# Patient Record
Sex: Male | Born: 1950 | Race: White | Hispanic: No | Marital: Married | Smoking: Never smoker
Health system: Southern US, Community
[De-identification: ages and names within clinical notes are randomized; demographics above are authoritative.]

## PROBLEM LIST (undated history)

## (undated) DIAGNOSIS — E785 Hyperlipidemia, unspecified: Secondary | ICD-10-CM

## (undated) DIAGNOSIS — I10 Essential (primary) hypertension: Secondary | ICD-10-CM

## (undated) DIAGNOSIS — K5792 Diverticulitis of intestine, part unspecified, without perforation or abscess without bleeding: Secondary | ICD-10-CM

## (undated) DIAGNOSIS — Z85038 Personal history of other malignant neoplasm of large intestine: Secondary | ICD-10-CM

## (undated) DIAGNOSIS — Z8719 Personal history of other diseases of the digestive system: Secondary | ICD-10-CM

## (undated) HISTORY — DX: Personal history of other diseases of the digestive system: Z87.19

## (undated) HISTORY — DX: Personal history of other malignant neoplasm of large intestine: Z85.038

## (undated) HISTORY — DX: Hyperlipidemia, unspecified: E78.5

## (undated) HISTORY — PX: COLONOSCOPY: SHX174

## (undated) HISTORY — DX: Diverticulitis of intestine, part unspecified, without perforation or abscess without bleeding: K57.92

## (undated) HISTORY — DX: Essential (primary) hypertension: I10

---

## 2006-07-22 HISTORY — PX: HEMICOLECTOMY: SHX854

## 2006-08-18 ENCOUNTER — Encounter: Payer: Self-pay | Admitting: Family Medicine

## 2008-12-31 ENCOUNTER — Ambulatory Visit: Payer: Self-pay | Admitting: Family Medicine

## 2008-12-31 DIAGNOSIS — Z9189 Other specified personal risk factors, not elsewhere classified: Secondary | ICD-10-CM | POA: Insufficient documentation

## 2008-12-31 DIAGNOSIS — C189 Malignant neoplasm of colon, unspecified: Secondary | ICD-10-CM | POA: Insufficient documentation

## 2009-01-01 DIAGNOSIS — I10 Essential (primary) hypertension: Secondary | ICD-10-CM | POA: Insufficient documentation

## 2009-01-01 DIAGNOSIS — Z85038 Personal history of other malignant neoplasm of large intestine: Secondary | ICD-10-CM | POA: Insufficient documentation

## 2009-01-01 DIAGNOSIS — E785 Hyperlipidemia, unspecified: Secondary | ICD-10-CM | POA: Insufficient documentation

## 2009-01-01 DIAGNOSIS — R55 Syncope and collapse: Secondary | ICD-10-CM

## 2009-01-01 DIAGNOSIS — Z8719 Personal history of other diseases of the digestive system: Secondary | ICD-10-CM | POA: Insufficient documentation

## 2009-01-03 ENCOUNTER — Ambulatory Visit: Payer: Self-pay | Admitting: Oncology

## 2009-01-30 ENCOUNTER — Encounter: Payer: Self-pay | Admitting: Family Medicine

## 2009-03-18 ENCOUNTER — Encounter (INDEPENDENT_AMBULATORY_CARE_PROVIDER_SITE_OTHER): Payer: Self-pay | Admitting: *Deleted

## 2009-03-18 ENCOUNTER — Ambulatory Visit: Payer: Self-pay | Admitting: Family Medicine

## 2009-03-18 DIAGNOSIS — R5381 Other malaise: Secondary | ICD-10-CM

## 2009-03-18 DIAGNOSIS — R5383 Other fatigue: Secondary | ICD-10-CM

## 2009-03-18 LAB — CONVERTED CEMR LAB
ALT: 35 units/L (ref 0–53)
AST: 30 units/L (ref 0–37)
BUN: 11 mg/dL (ref 6–23)
Basophils Absolute: 0 10*3/uL (ref 0.0–0.1)
Basophils Relative: 0.4 % (ref 0.0–3.0)
CO2: 27 meq/L (ref 19–32)
Chloride: 106 meq/L (ref 96–112)
Cholesterol: 149 mg/dL (ref 0–200)
Creatinine, Ser: 0.9 mg/dL (ref 0.4–1.5)
HCT: 44.3 % (ref 39.0–52.0)
Hemoglobin: 15.4 g/dL (ref 13.0–17.0)
LDL Cholesterol: 77 mg/dL (ref 0–99)
Lymphocytes Relative: 20 % (ref 12.0–46.0)
Lymphs Abs: 1.3 10*3/uL (ref 0.7–4.0)
MCHC: 34.7 g/dL (ref 30.0–36.0)
Monocytes Relative: 7.9 % (ref 3.0–12.0)
Neutro Abs: 4.6 10*3/uL (ref 1.4–7.7)
Potassium: 4.1 meq/L (ref 3.5–5.1)
RBC: 5.11 M/uL (ref 4.22–5.81)
RDW: 12.5 % (ref 11.5–14.6)
Total CHOL/HDL Ratio: 3
Total Protein: 7.8 g/dL (ref 6.0–8.3)
Triglycerides: 140 mg/dL (ref 0.0–149.0)

## 2009-05-22 ENCOUNTER — Telehealth: Payer: Self-pay | Admitting: Family Medicine

## 2009-07-10 ENCOUNTER — Ambulatory Visit: Payer: Self-pay | Admitting: Family Medicine

## 2009-07-11 LAB — CONVERTED CEMR LAB: PSA: 2.41 ng/mL (ref 0.10–4.00)

## 2009-08-07 ENCOUNTER — Ambulatory Visit: Payer: Self-pay | Admitting: Oncology

## 2009-08-12 ENCOUNTER — Encounter: Payer: Self-pay | Admitting: Family Medicine

## 2009-12-11 ENCOUNTER — Telehealth: Payer: Self-pay | Admitting: Family Medicine

## 2009-12-17 ENCOUNTER — Ambulatory Visit: Payer: Self-pay | Admitting: Family Medicine

## 2009-12-17 LAB — CONVERTED CEMR LAB
AST: 29 units/L (ref 0–37)
Albumin: 4.6 g/dL (ref 3.5–5.2)
BUN: 9 mg/dL (ref 6–23)
Basophils Relative: 0.1 % (ref 0.0–3.0)
Calcium: 9.4 mg/dL (ref 8.4–10.5)
Cholesterol: 155 mg/dL (ref 0–200)
Creatinine, Ser: 1 mg/dL (ref 0.4–1.5)
Eosinophils Relative: 3 % (ref 0.0–5.0)
GFR calc non Af Amer: 80.83 mL/min (ref >60)
Glucose, Bld: 98 mg/dL (ref 70–99)
HCT: 44.7 % (ref 39.0–52.0)
HDL: 48.5 mg/dL (ref >39.00)
Hemoglobin: 15 g/dL (ref 13.0–17.0)
LDL Cholesterol: 67 mg/dL (ref 0–99)
Lymphs Abs: 1.2 10*3/uL (ref 0.7–4.0)
MCV: 86.8 fL (ref 78.0–100.0)
Monocytes Absolute: 0.5 10*3/uL (ref 0.1–1.0)
Monocytes Relative: 8.7 % (ref 3.0–12.0)
PSA: 1.74 ng/mL (ref 0.10–4.00)
Platelets: 189 10*3/uL (ref 150.0–400.0)
Potassium: 3.9 meq/L (ref 3.5–5.1)
RBC: 5.15 M/uL (ref 4.22–5.81)
Total Bilirubin: 0.6 mg/dL (ref 0.3–1.2)
Triglycerides: 196 mg/dL — ABNORMAL HIGH (ref 0.0–149.0)
VLDL: 39.2 mg/dL (ref 0.0–40.0)
WBC: 5.5 10*3/uL (ref 4.5–10.5)

## 2009-12-25 ENCOUNTER — Ambulatory Visit: Payer: Self-pay | Admitting: Family Medicine

## 2010-04-03 ENCOUNTER — Ambulatory Visit: Payer: Self-pay | Admitting: Oncology

## 2010-04-09 ENCOUNTER — Encounter: Payer: Self-pay | Admitting: Family Medicine

## 2010-10-09 ENCOUNTER — Encounter: Payer: Self-pay | Admitting: Family Medicine

## 2010-10-21 NOTE — Assessment & Plan Note (Signed)
Summary: CPX/DLO   Vital Signs:  Patient profile:   60 year old male Height:      68.5 inches Weight:      197.6 pounds BMI:     29.71 Temp:     98.2 degrees F oral Pulse rate:   88 / minute Pulse rhythm:   regular BP sitting:   128 / 82  (left arm) Cuff size:   regular  Vitals Entered By: Benny Lennert CMA Duncan Dull) (December 25, 2009 8:40 AM)  History of Present Illness: Chief complaint cpx   h/o colon CA  DRE  2007, had a hemicolectomy - to have f/u in 08/2010  Feels a whole lot better, not feeling any anxiety.   Preventive Screening-Counseling & Management  Alcohol-Tobacco     Alcohol drinks/day: 0     Alcohol Counseling: not indicated; use of alcohol is not excessive or problematic     Smoking Status: never  Caffeine-Diet-Exercise     Does Patient Exercise: no     Exercise Counseling: to improve exercise regimen  Hep-HIV-STD-Contraception     STD Risk: no risk noted     Testicular SE Education/Counseling to perform regular STE      Sexual History:  currently monogamous.        Drug Use:  never.    Allergies: 1)  ! Penicillin 2)  ! Percocet  Past History:  Past medical, surgical, family and social histories (including risk factors) reviewed, and no changes noted (except as noted below).  Past Medical History: Reviewed history from 12/31/2008 and no changes required. HYPERTENSION HYPERLIPIDEMIA  DIVERTICULITIS COLON CANCER, Stage II Adenocarcinoma of colon     Past Surgical History: Reviewed history from 12/31/2008 and no changes required. Hemicolectomy 07-2006  Colonoscopy, surveilance, 09/18/08 - no CA, repeat 3 years  Family History: Reviewed history and no changes required.  Social History: Reviewed history from 12/31/2008 and no changes required. Retired, recently moved to [place] [employer] [place] Married Never Smoked Alcohol use-yes Regular exercise-yesDoes Patient Exercise:  no STD Risk:  no risk noted Sexual History:  currently  monogamous Drug Use:  never  Review of Systems  General: Denies fever, chills, sweats, anorexia, fatigue, weakness, malaise Eyes: Denies blurring, vision loss ENT: Denies earache, nasal congestion, nosebleeds, sore throat, and hoarseness.  Cardiovascular: Denies chest pains, palpitations, syncope, dyspnea on exertion,  Respiratory: Denies cough, dyspnea at rest, excessive sputum,wheeezing GI: Denies nausea, vomiting, diarrhea, constipation, change in bowel habits, abdominal pain, melena, hematochezia GU: Denies dysuria, hematuria, discharge, urinary frequency, urinary hesitancy, nocturia, incontinence, genital sores, decreased libido Musculoskeletal: Denies back pain, joint pain Derm: Denies rash, itching Neuro: Denies  paresthesias, frequent falls, frequent headaches, and difficulty walking.  Psych: Denies depression, anxiety Endocrine: Denies cold intolerance, heat intolerance, polydipsia, polyphagia, polyuria, and unusual weight change.  Heme: Denies enlarged lymph nodes Allergy: No hayfever   Otherwise, the pertinent positives and negatives are listed above and in the HPI, otherwise a full review of systems has been reviewed and is negative unless noted positive.    Impression & Recommendations:  Problem # 1:  HEALTH MAINTENANCE EXAM (ICD-V70.0) The patient's preventative maintenance and recommended screening tests for an annual wellness exam were reviewed in full today. Brought up to date unless services declined.  Counselled on the importance of diet, exercise, and its role in overall health and mortality. The patient's FH and SH was reviewed, including their home life, tobacco status, and drug and alcohol status.   reviewed all labs  Complete Medication  List: 1)  Pravastatin Sodium 80 Mg Tabs (Pravastatin sodium) .... One by mouth at bedtime 2)  Lisinopril 10 Mg Tabs (Lisinopril) .... Take 1 tab by mouth daily 3)  Aspirin 81 Mg Tbec (Aspirin) .... One by mouth every  day  Patient Instructions: 1)  f/u 1 year 2)  Prephysical Labs, several days before, fasting 3)  BMP, HFP, FLP, CBC with diff, TSH, PSA: 272.4, v77.1, ,780.79, v76.44  4)  153.9, CEA  Current Allergies (reviewed today): ! PENICILLIN ! PERCOCET  Physical Exam General Appearance: well developed, well nourished, no acute distress Eyes: conjunctiva and lids normal, PERRLA, EOMI Ears, Nose, Mouth, Throat: TM clear, nares clear, oral exam WNL Neck: supple, no lymphadenopathy, no thyromegaly, no JVD Respiratory: clear to auscultation and percussion, respiratory effort normal Cardiovascular: regular rate and rhythm, S1-S2, no murmur, rub or gallop, no bruits, peripheral pulses normal and symmetric, no cyanosis, clubbing, edema or varicosities Chest: no scars, masses, tenderness; no asymmetry, skin changes, nipple discharge, no gynecomastia   Gastrointestinal: soft, non-tender; no hepatosplenomegaly, masses; active bowel sounds all quadrants Genitourinary: no hernia, testicular mass, penile discharge, priapism or prostate enlargement Lymphatic: no cervical, axillary or inguinal adenopathy Musculoskeletal: gait normal, muscle tone and strength WNL, no joint swelling, effusions, discoloration, crepitus  Skin: clear, good turgor, color WNL, no rashes, lesions, or ulcerations Neurologic: normal mental status, normal reflexes, normal strength, sensation, and motion Psychiatric: alert; oriented to person, place and time Other Exam:

## 2010-10-21 NOTE — Progress Notes (Signed)
Summary: regarding ordered labs  Phone Note Call from Patient Call back at Home Phone [phone]   Caller: Patient Summary of Call: Pt is coming in for labs next week and Dr. Park Breed at cancer center wants a CMET drawn, pt is scheduled for BMET.  OK to change?   Initial call taken by: Lowella Petties CMA,  December 11, 2009 12:56 PM  Follow-up for Phone Call        yes Follow-up by: Hannah Beat MD,  December 11, 2009 1:01 PM

## 2010-10-21 NOTE — Letter (Signed)
[facility]   Imported By: Lanelle Bal 04/17/2010 08:57:54  _____________________________________________________________________  External Attachment:    Type:   Image     Comment:   External Document

## 2010-10-23 NOTE — Miscellaneous (Signed)
Summary: med list update- pravastatin dose change  Clinical Lists Changes  Medications: Changed medication from PRAVASTATIN SODIUM 80 MG  TABS (PRAVASTATIN SODIUM) one by mouth at bedtime to PRAVASTATIN SODIUM 40 MG TABS (PRAVASTATIN SODIUM) take two by mouth at bedtime     Prior Medications: PRAVASTATIN SODIUM 40 MG TABS (PRAVASTATIN SODIUM) take two by mouth at bedtime LISINOPRIL 10 MG  TABS (LISINOPRIL) Take 1 tab by mouth daily ASPIRIN 81 MG  TBEC (ASPIRIN) one by mouth every day Current Allergies: ! PENICILLIN ! PERCOCET [employer] [address] requested to change pravastatin dose and directions since pt does not have insurance and the 40 mg's will be less expensive for him.               Lowella Petties CMA, AAMA  October 09, 2010 1:11 PM  ok.Taedyn Glasscock MD  October 09, 2010 1:39 PM

## 2010-11-12 ENCOUNTER — Encounter: Payer: Self-pay | Admitting: Family Medicine

## 2010-11-12 DIAGNOSIS — K5792 Diverticulitis of intestine, part unspecified, without perforation or abscess without bleeding: Secondary | ICD-10-CM

## 2010-11-12 DIAGNOSIS — I10 Essential (primary) hypertension: Secondary | ICD-10-CM | POA: Insufficient documentation

## 2010-11-12 DIAGNOSIS — E785 Hyperlipidemia, unspecified: Secondary | ICD-10-CM | POA: Insufficient documentation

## 2010-11-12 DIAGNOSIS — Z85038 Personal history of other malignant neoplasm of large intestine: Secondary | ICD-10-CM

## 2010-12-24 ENCOUNTER — Other Ambulatory Visit: Payer: Self-pay | Admitting: Family Medicine

## 2010-12-24 DIAGNOSIS — R5383 Other fatigue: Secondary | ICD-10-CM

## 2010-12-24 DIAGNOSIS — C189 Malignant neoplasm of colon, unspecified: Secondary | ICD-10-CM

## 2010-12-24 DIAGNOSIS — Z125 Encounter for screening for malignant neoplasm of prostate: Secondary | ICD-10-CM

## 2010-12-24 DIAGNOSIS — E785 Hyperlipidemia, unspecified: Secondary | ICD-10-CM

## 2010-12-24 DIAGNOSIS — Z131 Encounter for screening for diabetes mellitus: Secondary | ICD-10-CM

## 2010-12-29 ENCOUNTER — Other Ambulatory Visit (INDEPENDENT_AMBULATORY_CARE_PROVIDER_SITE_OTHER): Payer: Self-pay | Admitting: Family Medicine

## 2010-12-29 DIAGNOSIS — E785 Hyperlipidemia, unspecified: Secondary | ICD-10-CM

## 2010-12-29 DIAGNOSIS — C189 Malignant neoplasm of colon, unspecified: Secondary | ICD-10-CM

## 2010-12-29 DIAGNOSIS — Z125 Encounter for screening for malignant neoplasm of prostate: Secondary | ICD-10-CM

## 2010-12-29 DIAGNOSIS — Z131 Encounter for screening for diabetes mellitus: Secondary | ICD-10-CM

## 2010-12-29 DIAGNOSIS — R5381 Other malaise: Secondary | ICD-10-CM

## 2010-12-29 DIAGNOSIS — R5383 Other fatigue: Secondary | ICD-10-CM

## 2010-12-29 LAB — CBC WITH DIFFERENTIAL/PLATELET
Basophils Relative: 0.5 % (ref 0.0–3.0)
Eosinophils Absolute: 0.1 10*3/uL (ref 0.0–0.7)
Eosinophils Relative: 1.3 % (ref 0.0–5.0)
Lymphocytes Relative: 18.3 % (ref 12.0–46.0)
Monocytes Absolute: 0.6 10*3/uL (ref 0.1–1.0)
Neutrophils Relative %: 71.1 % (ref 43.0–77.0)
Platelets: 219 10*3/uL (ref 150.0–400.0)
RBC: 5.29 Mil/uL (ref 4.22–5.81)
WBC: 6.4 10*3/uL (ref 4.5–10.5)

## 2010-12-29 LAB — HEPATIC FUNCTION PANEL
ALT: 29 U/L (ref 0–53)
Alkaline Phosphatase: 60 U/L (ref 39–117)
Bilirubin, Direct: 0.1 mg/dL (ref 0.0–0.3)
Total Protein: 7.7 g/dL (ref 6.0–8.3)

## 2010-12-29 LAB — BASIC METABOLIC PANEL
BUN: 11 mg/dL (ref 6–23)
Calcium: 9.8 mg/dL (ref 8.4–10.5)
Creatinine, Ser: 1.1 mg/dL (ref 0.4–1.5)
GFR: 75.32 mL/min (ref >60.00)
Potassium: 4.2 mEq/L (ref 3.5–5.1)

## 2010-12-29 LAB — LIPID PANEL
HDL: 50.9 mg/dL (ref >39.00)
LDL Cholesterol: 83 mg/dL (ref 0–99)
Total CHOL/HDL Ratio: 3
VLDL: 24 mg/dL (ref 0.0–40.0)

## 2010-12-29 LAB — PSA: PSA: 1.79 ng/mL (ref 0.10–4.00)

## 2010-12-29 LAB — TSH: TSH: 1.86 u[IU]/mL (ref 0.35–5.50)

## 2010-12-30 LAB — CEA: CEA: 1.3 ng/mL (ref 0.0–5.0)

## 2011-01-01 ENCOUNTER — Ambulatory Visit (INDEPENDENT_AMBULATORY_CARE_PROVIDER_SITE_OTHER): Payer: Self-pay | Admitting: Family Medicine

## 2011-01-01 ENCOUNTER — Encounter: Payer: Self-pay | Admitting: Family Medicine

## 2011-01-01 VITALS — BP 140/80 | HR 90 | Temp 97.9°F | Ht 69.25 in | Wt 191.1 lb

## 2011-01-01 DIAGNOSIS — Z85038 Personal history of other malignant neoplasm of large intestine: Secondary | ICD-10-CM

## 2011-01-01 DIAGNOSIS — Z0000 Encounter for general adult medical examination without abnormal findings: Secondary | ICD-10-CM

## 2011-01-01 NOTE — Patient Instructions (Signed)
REFERRAL: GO THE THE FRONT ROOM AT THE ENTRANCE OF OUR CLINIC, NEAR CHECK IN. ASK FOR Randy Boyle. SHE WILL HELP YOU SET UP YOUR REFERRAL. DATE: TIME:  

## 2011-01-01 NOTE — Progress Notes (Signed)
  Colonoscopy - needed.   A little light headed with some digging occ, otherwise feeling great  Preventative Health Maintenance Visit:  Health Maintenance Summary Reviewed and updated, unless pt declines services.  Tobacco History Reviewed. Alcohol: No concerns, no excessive use Exercise Habits: Some activity, rec at least 30 mins 5 times a week STD concerns: no risk or activity to increase risk Drug Use: None Encouraged self-testicular check  Labs reviewed with the patient.  The PMH, PSH, Social History, Family History, Medications, and allergies have been reviewed in [facility], and have been updated if relevant.  General: Denies fever, chills, sweats. No significant weight loss. Eyes: Denies blurring,significant itching ENT: Denies earache, sore throat, and hoarseness. Cardiovascular: Denies chest pains, palpitations, dyspnea on exertion Respiratory: Denies cough, dyspnea at rest,wheeezing Breast: no concerns about lumps GI: Denies nausea, vomiting, diarrhea, constipation, change in bowel habits, abdominal pain, melena, hematochezia GU: Denies penile discharge, ED, urinary flow / outflow problems. No STD concerns. Musculoskeletal: Denies back pain, joint pain Derm: Denies rash, itching Neuro: Denies  paresthesias, frequent falls, frequent headaches Psych: Denies depression, anxiety Endocrine: Denies cold intolerance, heat intolerance, polydipsia Heme: Denies enlarged lymph nodes Allergy: No hayfever  PE: GEN: well developed, well nourished, no acute distress Eyes: conjunctiva and lids normal, PERRLA, EOMI ENT: TM clear, nares clear, oral exam WNL Neck: supple, no lymphadenopathy, no thyromegaly, no JVD Pulm: clear to auscultation and percussion, respiratory effort normal CV: regular rate and rhythm, S1-S2, no murmur, rub or gallop, no bruits, peripheral pulses normal and symmetric, no cyanosis, clubbing, edema or varicosities Chest: no scars, masses, no gynecomastia   GI: soft,  non-tender; no hepatosplenomegaly, masses; active bowel sounds all quadrants GU: no hernia, testicular mass, penile discharge, priapism or prostate enlargement Lymph: no cervical, axillary or inguinal adenopathy MSK: gait normal, muscle tone and strength WNL, no joint swelling, effusions, discoloration, crepitus  SKIN: clear, good turgor, color WNL, no rashes, lesions, or ulcerations Neuro: normal mental status, normal strength, sensation, and motion Psych: alert; oriented to person, place and time, normally interactive and not anxious or depressed in appearance.  A/P: CPX: The patient's preventative maintenance and recommended screening tests for an annual wellness exam were reviewed in full today. Brought up to date unless services declined.  Counselled on the importance of diet, exercise, and its role in overall health and mortality. The patient's FH and SH was reviewed, including their home life, tobacco status, and drug and alcohol status.  H/o colon CA, needs to est with GI and have f/u colonoscopy. Referral made

## 2011-01-26 ENCOUNTER — Ambulatory Visit: Payer: Self-pay | Admitting: Gastroenterology

## 2011-02-11 ENCOUNTER — Other Ambulatory Visit: Payer: Self-pay | Admitting: Family Medicine

## 2011-07-04 ENCOUNTER — Other Ambulatory Visit: Payer: Self-pay | Admitting: Family Medicine

## 2011-07-24 ENCOUNTER — Telehealth: Payer: Self-pay | Admitting: *Deleted

## 2011-09-04 ENCOUNTER — Other Ambulatory Visit: Payer: Self-pay

## 2011-09-04 ENCOUNTER — Ambulatory Visit: Payer: Self-pay | Admitting: Oncology

## 2011-11-10 ENCOUNTER — Other Ambulatory Visit: Payer: Self-pay | Admitting: Family Medicine

## 2011-12-28 ENCOUNTER — Other Ambulatory Visit (INDEPENDENT_AMBULATORY_CARE_PROVIDER_SITE_OTHER): Payer: PRIVATE HEALTH INSURANCE

## 2011-12-28 DIAGNOSIS — Z13220 Encounter for screening for lipoid disorders: Secondary | ICD-10-CM

## 2011-12-28 DIAGNOSIS — Z79899 Other long term (current) drug therapy: Secondary | ICD-10-CM

## 2011-12-28 DIAGNOSIS — Z85038 Personal history of other malignant neoplasm of large intestine: Secondary | ICD-10-CM

## 2011-12-28 DIAGNOSIS — Z125 Encounter for screening for malignant neoplasm of prostate: Secondary | ICD-10-CM

## 2011-12-28 LAB — BASIC METABOLIC PANEL
CO2: 25 mEq/L (ref 19–32)
Calcium: 9.8 mg/dL (ref 8.4–10.5)
Sodium: 138 mEq/L (ref 135–145)

## 2011-12-28 LAB — CEA: CEA: 1.2 ng/mL (ref 0.0–5.0)

## 2011-12-28 LAB — CBC WITH DIFFERENTIAL/PLATELET
Eosinophils Relative: 1.9 % (ref 0.0–5.0)
HCT: 45.3 % (ref 39.0–52.0)
Hemoglobin: 15.3 g/dL (ref 13.0–17.0)
Lymphs Abs: 1.2 10*3/uL (ref 0.7–4.0)
MCV: 87.2 fl (ref 78.0–100.0)
Monocytes Absolute: 0.6 10*3/uL (ref 0.1–1.0)
Monocytes Relative: 7.8 % (ref 3.0–12.0)
Neutro Abs: 6.3 10*3/uL (ref 1.4–7.7)
Platelets: 183 10*3/uL (ref 150.0–400.0)
WBC: 8.3 10*3/uL (ref 4.5–10.5)

## 2011-12-28 LAB — LIPID PANEL
HDL: 47.2 mg/dL (ref >39.00)
Total CHOL/HDL Ratio: 4
Triglycerides: 170 mg/dL — ABNORMAL HIGH (ref 0.0–149.0)

## 2011-12-28 LAB — HEPATIC FUNCTION PANEL
Alkaline Phosphatase: 60 U/L (ref 39–117)
Bilirubin, Direct: 0.1 mg/dL (ref 0.0–0.3)
Total Bilirubin: 0.5 mg/dL (ref 0.3–1.2)
Total Protein: 7.5 g/dL (ref 6.0–8.3)

## 2012-01-04 ENCOUNTER — Ambulatory Visit (INDEPENDENT_AMBULATORY_CARE_PROVIDER_SITE_OTHER)
Admission: RE | Admit: 2012-01-04 | Discharge: 2012-01-04 | Disposition: A | Discharge status: Home / Self Care | Payer: PRIVATE HEALTH INSURANCE | Source: Ambulatory Visit | Attending: Family Medicine | Admitting: Family Medicine

## 2012-01-04 ENCOUNTER — Ambulatory Visit (INDEPENDENT_AMBULATORY_CARE_PROVIDER_SITE_OTHER): Payer: PRIVATE HEALTH INSURANCE | Admitting: Family Medicine

## 2012-01-04 ENCOUNTER — Encounter: Payer: Self-pay | Admitting: Family Medicine

## 2012-01-04 VITALS — BP 140/78 | HR 88 | Temp 98.0°F | Ht 69.5 in | Wt 202.8 lb

## 2012-01-04 DIAGNOSIS — M25561 Pain in right knee: Secondary | ICD-10-CM

## 2012-01-04 DIAGNOSIS — M25569 Pain in unspecified knee: Secondary | ICD-10-CM

## 2012-01-04 DIAGNOSIS — Z85038 Personal history of other malignant neoplasm of large intestine: Secondary | ICD-10-CM

## 2012-01-04 DIAGNOSIS — Z0000 Encounter for general adult medical examination without abnormal findings: Secondary | ICD-10-CM
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
## 2012-01-04 NOTE — Patient Instructions (Signed)
REFERRAL: GO THE THE FRONT ROOM AT THE ENTRANCE OF OUR CLINIC, NEAR CHECK IN. ASK FOR MARION. SHE WILL HELP YOU SET UP YOUR REFERRAL. DATE: TIME:  

## 2012-01-25 ENCOUNTER — Other Ambulatory Visit: Payer: Self-pay | Admitting: Family Medicine

## 2012-03-07 ENCOUNTER — Encounter: Payer: Self-pay | Admitting: Family Medicine

## 2012-03-07 ENCOUNTER — Ambulatory Visit (INDEPENDENT_AMBULATORY_CARE_PROVIDER_SITE_OTHER): Payer: PRIVATE HEALTH INSURANCE | Admitting: Gastroenterology

## 2012-03-07 ENCOUNTER — Encounter: Payer: Self-pay | Admitting: Gastroenterology

## 2012-03-07 ENCOUNTER — Ambulatory Visit (INDEPENDENT_AMBULATORY_CARE_PROVIDER_SITE_OTHER): Payer: PRIVATE HEALTH INSURANCE | Admitting: Family Medicine

## 2012-03-07 VITALS — BP 124/78 | HR 84 | Temp 98.9°F | Ht 69.5 in | Wt 200.0 lb

## 2012-03-07 VITALS — BP 120/68 | HR 80 | Ht 69.5 in | Wt 199.0 lb

## 2012-03-07 DIAGNOSIS — IMO0001 Reserved for inherently not codable concepts without codable children: Secondary | ICD-10-CM

## 2012-03-07 DIAGNOSIS — S61459A Open bite of unspecified hand, initial encounter: Secondary | ICD-10-CM

## 2012-03-07 DIAGNOSIS — S61409A Unspecified open wound of unspecified hand, initial encounter: Secondary | ICD-10-CM

## 2012-03-07 DIAGNOSIS — L03119 Cellulitis of unspecified part of limb: Secondary | ICD-10-CM

## 2012-03-07 DIAGNOSIS — Z1211 Encounter for screening for malignant neoplasm of colon: Secondary | ICD-10-CM

## 2012-03-07 DIAGNOSIS — T148XXA Other injury of unspecified body region, initial encounter: Secondary | ICD-10-CM

## 2012-03-07 DIAGNOSIS — Z85038 Personal history of other malignant neoplasm of large intestine: Secondary | ICD-10-CM

## 2012-03-07 MED ORDER — CLINDAMYCIN HCL 300 MG PO CAPS: 300.0000 mg | ORAL_CAPSULE | Freq: Four times a day (QID) | ORAL | Status: AC

## 2012-03-07 MED ORDER — MOVIPREP 100 G PO SOLR: 1.0000 | Freq: Once | ORAL | Status: DC

## 2012-03-07 MED ORDER — SULFAMETHOXAZOLE-TRIMETHOPRIM 800-160 MG PO TABS: 1.0000 | ORAL_TABLET | Freq: Two times a day (BID) | ORAL | Status: AC

## 2012-03-07 NOTE — Patient Instructions (Addendum)
You have been scheduled for a colonoscopy with propofol. Please follow written instructions given to you at your visit today.  Please pick up your prep kit at the pharmacy within the next 1-3 days. cc: Hannah Beat, MD

## 2012-03-07 NOTE — Progress Notes (Signed)
History of Present Illness: This is a 61 year old male here today with his wife. He was diagnosed with stage II colon cancer in 2007 and underwent right hemicolectomy in [place]. [place]. A followup colonoscopy 2008 showed changes consistent with his prior surgery, diverticulosis and internal hemorrhoids. He has not had a colonoscopy since then. He states he had an oncology followup visit with Dr. Park Breed about one year ago but he had not had regular oncology followup until the appointment with Dr. Park Breed. Denies weight loss, abdominal pain, constipation, diarrhea, change in stool caliber, melena, hematochezia, nausea, vomiting, dysphagia, reflux symptoms, chest pain.  Review of Systems: Pertinent positive and negative review of systems were noted in the above HPI section. All other review of systems were otherwise negative.  Current Medications, Allergies, Past Medical History, Past Surgical History, Family History and Social History were reviewed in [employer] record.  Physical Exam: General: Well developed , well nourished, no acute distress Head: Normocephalic and atraumatic Eyes:  sclerae anicteric, EOMI Ears: Normal auditory acuity Mouth: No deformity or lesions Neck: Supple, no masses or thyromegaly Lungs: Clear throughout to auscultation Heart: Regular rate and rhythm; no murmurs, rubs or bruits Abdomen: Soft, non tender and non distended. No masses, hepatosplenomegaly or hernias noted. Normal Bowel sounds Rectal: Deferred to colonoscopy Musculoskeletal: Symmetrical with no gross deformities  Skin: No lesions on visible extremities Pulses:  Normal pulses noted Extremities: No clubbing, cyanosis, edema or deformities noted Neurological: Alert oriented x 4, grossly nonfocal Cervical Nodes:  No significant cervical adenopathy Inguinal Nodes: No significant inguinal adenopathy Psychological:  Alert and cooperative. Normal mood and affect  Assessment and  Recommendations:  1. Personal history of colon cancer, stage II, 2007. He is overdue for surveillance colonoscopy. The risks, benefits, and alternatives to colonoscopy with possible biopsy and possible polypectomy were discussed with the patient and they consent to proceed.

## 2012-03-08 NOTE — Progress Notes (Signed)
Nature conservation officer at [facility] [address] [place] [address] Phone: [phone] Fax: [phone]   Patient Name: Randy Boyle Date of Birth: [birth year] Age: 61 y.o. Medical Record Number: [id number] Gender: male Date of Encounter: 03/07/2012  History of Present Illness:  Randy Boyle is a 61 y.o. very pleasant male patient who presents with the following:  Pleasant penicillin allergic gentleman who presents after a cat bite on March 04, 2012. He has had some redness that has grown and become more prominent around the ulnar aspect of his palm. He does have one puncture wound between the 1st and 2nd digits. There is some surrounding redness, greater than a half dollar in size. He has not had any pus. No fevers or chills. It is mildly tender to palpate. He has not done any other care or sought other medical attention for this. Today, it actually feels a little bit better than it did yesterday.  Past Medical History, Surgical History, Social History, Family History, Problem List, Medications, and Allergies have been reviewed and updated if relevant.  Prior to Admission medications   Medication Sig Start Date End Date Taking? Authorizing Provider  aspirin 81 MG tablet Take 81 mg by mouth daily.     Yes Historical Provider, MD  lisinopril (PRINIVIL,ZESTRIL) 10 MG tablet TAKE ONE TABLET BY MOUTH EVERY DAY 01/25/12  Yes Bolivar Koranda, MD  pravastatin (PRAVACHOL) 40 MG tablet TAKE TWO TABLETS BY MOUTH AT BEDTIME 11/10/11  Yes Kytzia Gienger, MD  clindamycin (CLEOCIN) 300 MG capsule Take 1 capsule (300 mg total) by mouth 4 (four) times daily. 03/07/12 03/17/12  Hannah Beat, MD  MOVIPREP 100 G SOLR Take 1 kit (100 g total) by mouth once. 03/07/12   Meryl Dare, MD,FACG  sulfamethoxazole-trimethoprim (BACTRIM DS,SEPTRA DS) 800-160 MG per tablet Take 1 tablet by mouth 2 (two) times daily. 03/07/12 03/17/12  Hannah Beat, MD    Review of Systems: ROS: GEN: Acute illness details  above GI: Tolerating PO intake GU: maintaining adequate hydration and urination Pulm: No SOB Interactive and getting along well at home.  Otherwise, ROS is as per the HPI.   Physical Examination: Filed Vitals:   03/07/12 1541  BP: 124/78  Pulse: 84  Temp: 98.9 F (37.2 C)   Filed Vitals:   03/07/12 1541  Height: 5' 9.5" (1.765 m)  Weight: 200 lb (90.719 kg)   Body mass index is 29.11 kg/(m^2). Ideal Body Weight: Weight in (lb) to have BMI = 25: 171.4    GEN: WDWN, NAD, Non-toxic, Alert & Oriented x 3 HEENT: Atraumatic, Normocephalic.  Ears and Nose: No external deformity. EXTR: No clubbing/cyanosis/edema NEURO: Normal gait.  PSYCH: Normally interactive. Conversant. Not depressed or anxious appearing.  Calm demeanor.   Affected hand, with one evidence of puncture wound as described above between the 1st and 2nd digits on the dorsal aspect of the hand. There is some small amount of redness without fluctuance. It is approximately the size of a half-dollar across.  The patient is able to move his fingers freely without difficulty and make a full composite fist and has no pain with axial loading or other manipulation of the wrist.  Assessment and Plan:  1. Cat bite of hand   2. Cellulitis of hand    Bite, from a cat. Penicillin allergic, would use Cleocin and Septra DS for antibiotic coverage given high risk potential and the patient already has some redness in the area  Strict precautions given  Orders Today:  No orders of the defined types were placed in this encounter.    Medications Today: Meds ordered this encounter  Medications  . clindamycin (CLEOCIN) 300 MG capsule    Sig: Take 1 capsule (300 mg total) by mouth 4 (four) times daily.    Dispense:  56 capsule    Refill:  0  . sulfamethoxazole-trimethoprim (BACTRIM DS,SEPTRA DS) 800-160 MG per tablet    Sig: Take 1 tablet by mouth 2 (two) times daily.    Dispense:  28 tablet    Refill:  0     Hannah Beat, MD

## 2012-03-28 ENCOUNTER — Other Ambulatory Visit: Payer: Self-pay | Admitting: Family Medicine

## 2012-04-25 ENCOUNTER — Encounter: Payer: Self-pay | Admitting: Gastroenterology

## 2012-04-25 ENCOUNTER — Ambulatory Visit (AMBULATORY_SURGERY_CENTER): Payer: 59 | Admitting: Gastroenterology

## 2012-04-25 VITALS — BP 130/82 | HR 78 | Temp 97.8°F | Resp 23 | Ht 69.0 in | Wt 199.0 lb

## 2012-04-25 DIAGNOSIS — Z85038 Personal history of other malignant neoplasm of large intestine: Secondary | ICD-10-CM

## 2012-04-25 DIAGNOSIS — Z1211 Encounter for screening for malignant neoplasm of colon: Secondary | ICD-10-CM

## 2012-04-25 MED ORDER — SODIUM CHLORIDE 0.9 % IV SOLN: 500.0000 mL | INTRAVENOUS | Status: DC

## 2012-04-25 NOTE — Progress Notes (Signed)
PROPOFOL PER S CAMP CRNA. ALL MEDS TITRATED THROUGHOUT PROCEDURE PER CRNA. SEE SCANNED INTRA PROCEDURE REPORT. EWM

## 2012-04-25 NOTE — Progress Notes (Signed)
Patient did not experience any of the following events: a burn prior to discharge; a fall within the facility; wrong site/side/patient/procedure/implant event; or a hospital transfer or hospital admission upon discharge from the facility. ([id number]) Patient did not have preoperative order for IV antibiotic SSI prophylaxis. ([id number])  

## 2012-04-25 NOTE — Op Note (Signed)
 [facility] [address] [place], [place]  [address]  COLONOSCOPY PROCEDURE REPORT  PATIENT:  Randy Boyle, Randy Boyle  MR#:  [id number] BIRTHDATE:  23-Mar-1951, 61 yrs. old  GENDER:  male ENDOSCOPIST:  Judie Petit T. Russella Dar, MD, [facility] Referred by:  Elpidio Galea. Copland, M.D. PROCEDURE DATE:  04/25/2012 PROCEDURE:  Colonoscopy 32951 ASA CLASS:  Class II INDICATIONS:  1) surveillance and high-risk screening  2) history of colon cancer: Stage II, 2007 MEDICATIONS:   MAC sedation, administered by CRNA, propofol (Diprivan) 200 mg IV DESCRIPTION OF PROCEDURE:   After the risks benefits and alternatives of the procedure were thoroughly explained, informed consent was obtained.  Digital rectal exam was performed and revealed no abnormalities.   The LB CF-[id number] endoscope was introduced through the anus and advanced to the terminal ileum which was intubated for a short distance, without limitations. The quality of the prep was good, using MoviPrep.  The instrument was then slowly withdrawn as the colon was fully examined. <<PROCEDUREIMAGES>> FINDINGS:  Moderate diverticulosis was found in the sigmoid to descending colon. The right colon was surgically resected and an ileo-colonic anastamosis was seen.  Otherwise normal colonoscopy without other polyps, masses, vascular ectasias, or inflammatory changes.   Retroflexed views in the rectum revealed internal hemorrhoids, small.  The time to cecum =  1.75  minutes. The scope was then withdrawn (time =  7.75  min) from the patient and the procedure completed.  COMPLICATIONS:  None  ENDOSCOPIC IMPRESSION: 1) Moderate diverticulosis in the sigmoid to descending colon 2) Prior right hemi-colectomy 3) Internal hemorrhoids  RECOMMENDATIONS: 1) High fiber diet with liberal fluid intake. 2) Repeat Colonoscopy in 3 years.  Venita Lick. Russella Dar, MD, [facility]  n. eSIGNED:   Venita Lick. Wasil Wolke at 04/25/2012 10:08 AM  Hortense Ramal, [phone]

## 2012-04-25 NOTE — Patient Instructions (Addendum)
Discharge instructions given with verbal understanding. Handouts on diverticulosis and hemorrhoids given. Resume previous medications. YOU HAD AN ENDOSCOPIC PROCEDURE TODAY AT THE [facility]: Refer to the procedure report that was given to you for any specific questions about what was found during the examination.  If the procedure report does not answer your questions, please call your gastroenterologist to clarify.  If you requested that your care partner not be given the details of your procedure findings, then the procedure report has been included in a sealed envelope for you to review at your convenience later.  YOU SHOULD EXPECT: Some feelings of bloating in the abdomen. Passage of more gas than usual.  Walking can help get rid of the air that was put into your GI tract during the procedure and reduce the bloating. If you had a lower endoscopy (such as a colonoscopy or flexible sigmoidoscopy) you may notice spotting of blood in your stool or on the toilet paper. If you underwent a bowel prep for your procedure, then you may not have a normal bowel movement for a few days.  DIET: Your first meal following the procedure should be a light meal and then it is ok to progress to your normal diet.  A half-sandwich or bowl of soup is an example of a good first meal.  Heavy or fried foods are harder to digest and may make you feel nauseous or bloated.  Likewise meals heavy in dairy and vegetables can cause extra gas to form and this can also increase the bloating.  Drink plenty of fluids but you should avoid alcoholic beverages for 24 hours.  ACTIVITY: Your care partner should take you home directly after the procedure.  You should plan to take it easy, moving slowly for the rest of the day.  You can resume normal activity the day after the procedure however you should NOT DRIVE or use heavy machinery for 24 hours (because of the sedation medicines used during the test).    SYMPTOMS TO REPORT  IMMEDIATELY: A gastroenterologist can be reached at any hour.  During normal business hours, 8:30 AM to 5:00 PM Monday through Friday, call [phone].  After hours and on weekends, please call the GI answering service at [phone] who will take a message and have the physician on call contact you.   Following lower endoscopy (colonoscopy or flexible sigmoidoscopy):  Excessive amounts of blood in the stool  Significant tenderness or worsening of abdominal pains  Swelling of the abdomen that is new, acute  Fever of 100F or higher  FOLLOW UP: If any biopsies were taken you will be contacted by phone or by letter within the next 1-3 weeks.  Call your gastroenterologist if you have not heard about the biopsies in 3 weeks.  Our staff will call the home number listed on your records the next business day following your procedure to check on you and address any questions or concerns that you may have at that time regarding the information given to you following your procedure. This is a courtesy call and so if there is no answer at the home number and we have not heard from you through the emergency physician on call, we will assume that you have returned to your regular daily activities without incident.  SIGNATURES/CONFIDENTIALITY: You and/or your care partner have signed paperwork which will be entered into your electronic medical record.  These signatures attest to the fact that that the information above on your After Visit   Summary has been reviewed and is understood.  Full responsibility of the confidentiality of this discharge information lies with you and/or your care-partner. 

## 2012-04-26 ENCOUNTER — Telehealth: Payer: Self-pay | Admitting: *Deleted

## 2012-04-26 NOTE — Telephone Encounter (Signed)
No answer phone just rung attempted x2. No message left.

## 2012-05-06 ENCOUNTER — Telehealth: Payer: Self-pay

## 2012-05-06 NOTE — Telephone Encounter (Signed)
Pt has Lisinopril med for 2 more weeks; pt has had chronic cough and request change Lisinopril to Losartan.[facility] [address]. Pt request call back.

## 2012-05-07 NOTE — Telephone Encounter (Signed)
Stop lisinopril,   Send in losartan 50 mg, 1 po daily.  Electronically prescribe to the pharmacy of their choice. (May call in if pharmacy does not participate in electronic prescriptions) Call in #30, 11 refills. OR if they prefer a 90 day supply, #90 with 3 refills is OK, too Prescription instructions above   Very common side effect --- it usually takes about 6 weeks for the cough symptoms to slowly taper off.   Hope he is well.

## 2012-05-09 MED ORDER — LOSARTAN POTASSIUM 50 MG PO TABS: 50.0000 mg | ORAL_TABLET | Freq: Every day | ORAL | Status: DC

## 2012-05-09 NOTE — Telephone Encounter (Signed)
Patient advised and medication sent to pharmacy  

## 2013-01-24 IMAGING — CR DG KNEE STANDING AP BILAT
1 series · 1 of 1 positions shown · non-contrast
Comparison: None.

CLINICAL DATA: Right knee pain

BILATERAL KNEES STANDING - 1 VIEW

[view not recorded]
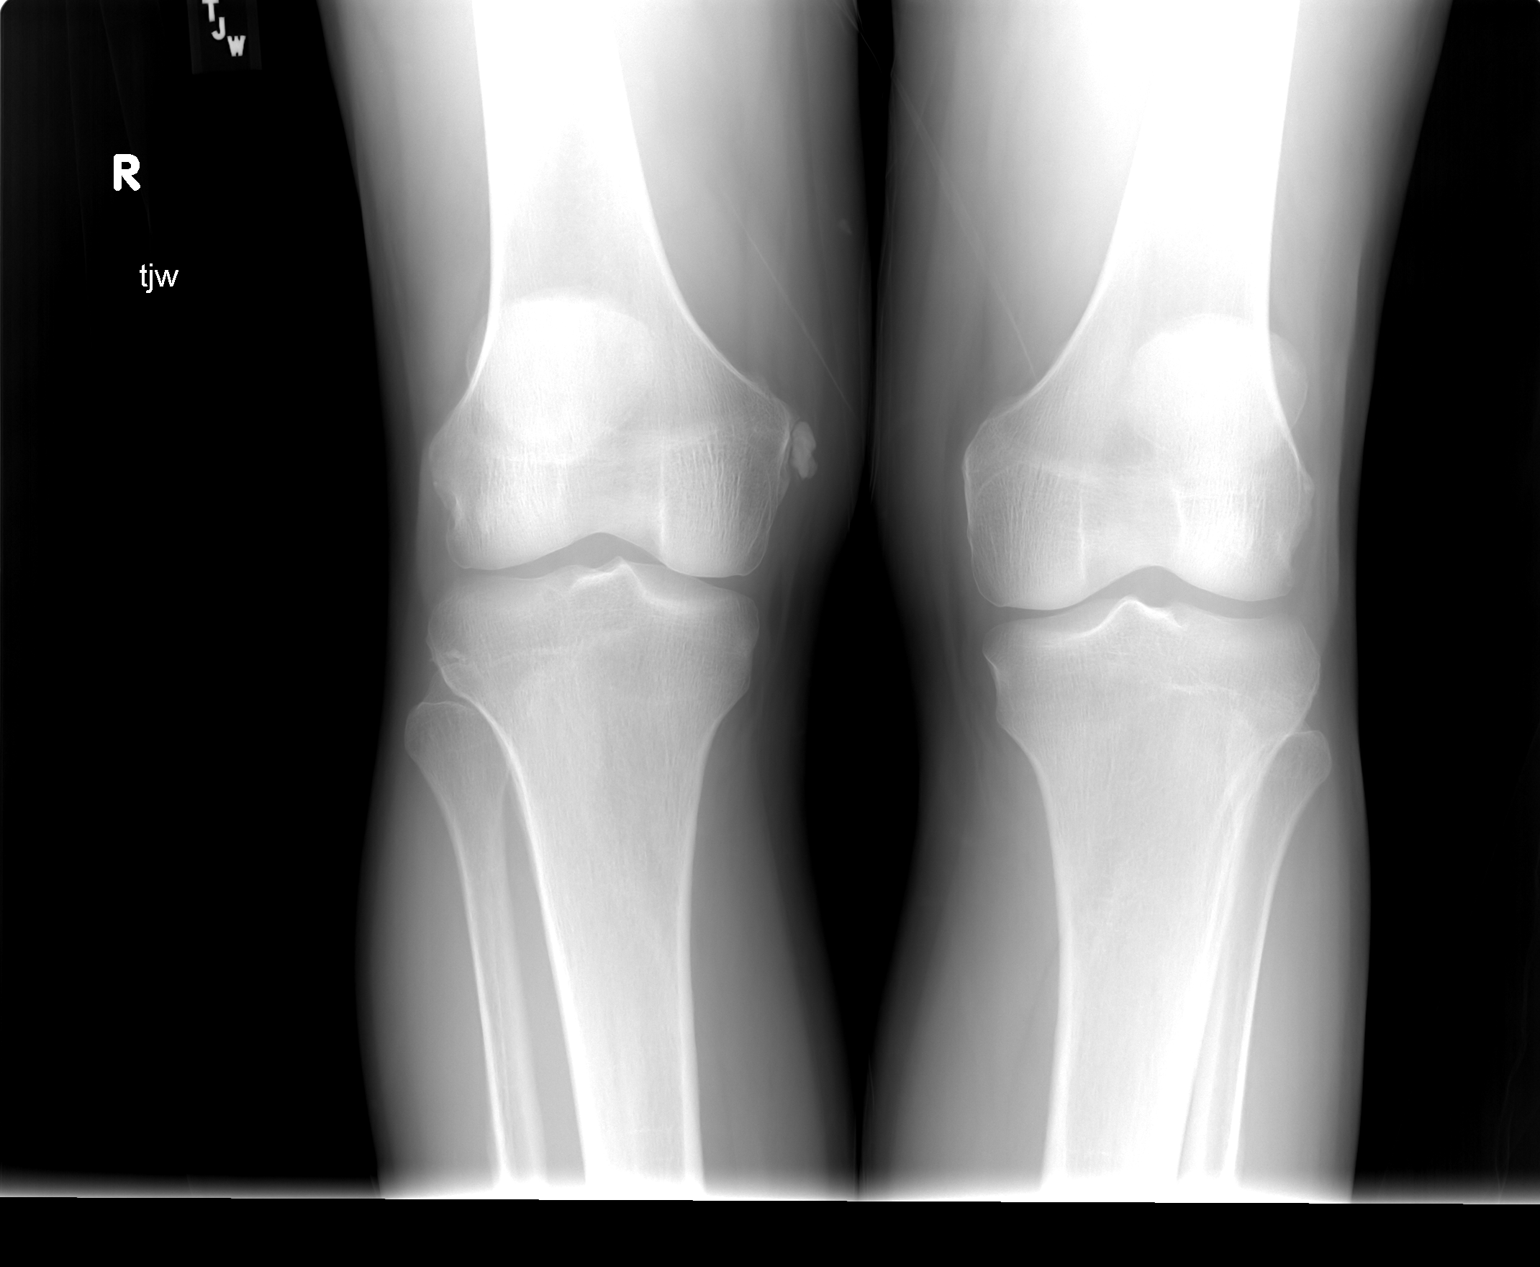

[1 of 1 positions shown; findings below may reference images not displayed]

FINDINGS: Standing views of the knees show the joint spaces to be
normal.  No acute bony abnormality is seen.  No chondrocalcinosis
is noted.  Alignment is normal.
IMPRESSION: Negative standing views of the knees.

## 2013-04-24 ENCOUNTER — Other Ambulatory Visit: Payer: Self-pay | Admitting: Family Medicine

## 2013-05-10 ENCOUNTER — Ambulatory Visit (INDEPENDENT_AMBULATORY_CARE_PROVIDER_SITE_OTHER): Payer: Self-pay | Admitting: Family Medicine

## 2013-05-10 ENCOUNTER — Encounter: Payer: Self-pay | Admitting: Family Medicine

## 2013-05-10 VITALS — BP 128/78 | HR 77 | Temp 98.4°F | Ht 69.5 in | Wt 204.2 lb

## 2013-05-10 DIAGNOSIS — I10 Essential (primary) hypertension: Secondary | ICD-10-CM

## 2013-05-10 DIAGNOSIS — E785 Hyperlipidemia, unspecified: Secondary | ICD-10-CM

## 2013-05-10 MED ORDER — FLUOXETINE HCL 20 MG PO TABS: 20.0000 mg | ORAL_TABLET | Freq: Every day | ORAL | Status: DC

## 2013-05-10 MED ORDER — LOSARTAN POTASSIUM 50 MG PO TABS: 50.0000 mg | ORAL_TABLET | Freq: Every day | ORAL | Status: DC

## 2013-05-10 NOTE — Progress Notes (Signed)
[facility] [address] [place] [address] Phone: [phone] Fax: [phone]  Date:  05/10/2013   Name:  Randy Boyle   DOB:  December 02, 1950   MRN:  [id number] Gender: male Age: 62 y.o.  Primary Physician:  Hannah Beat, MD  Evaluating MD: Hannah Beat, MD   Chief Complaint: Medication Refill   History of Present Illness:  Randy Boyle is a 62 y.o. pleasant patient who presents with the following:  F/u HTN Lipids: taking red yeast rice.  Taking htn meds Red yeast rice now for chol  F/u colon CA, GI?   Patient Active Problem List   Diagnosis Date Noted  . Routine general medical examination at a health care facility 01/01/2011  . Personal history of colon cancer, stage II   . OTHER MALAISE AND FATIGUE 03/18/2009  . HYPERLIPIDEMIA 01/01/2009  . HYPERTENSION 01/01/2009  . DIVERTICULITIS, HX OF 01/01/2009    Past Medical History  Diagnosis Date  . HTN (hypertension)   . HLD (hyperlipidemia)   . Diverticulitis   . Personal history of colon cancer, stage II     Adenocarcinoma  . Hx of pancreatitis     Past Surgical History  Procedure Laterality Date  . Hemicolectomy  07/2006    History   Social History  . Marital Status: Married    Spouse Name: N/A    Number of Children: 2  . Years of Education: N/A   Occupational History  . Retired    Social History Main Topics  . Smoking status: Never Smoker   . Smokeless tobacco: Never Used  . Alcohol Use: Yes     Comment: beer occassionally  . Drug Use: No  . Sexual Activity: Not on file   Other Topics Concern  . Not on file   Social History Narrative   [employer]    Family History  Problem Relation Age of Onset  . Colon cancer Neg Hx     Allergies  Allergen Reactions  . Oxycodone-Acetaminophen     REACTION: hallucinations  . Penicillins     REACTION: reacted as a child    Medication list has been reviewed and updated.  Outpatient Prescriptions Prior to  Visit  Medication Sig Dispense Refill  . aspirin 81 MG tablet Take 81 mg by mouth daily.        Marland Kitchen losartan (COZAAR) 50 MG tablet TAKE ONE TABLET BY MOUTH EVERY DAY  30 tablet  0  . pravastatin (PRAVACHOL) 40 MG tablet TAKE TWO TABLETS BY MOUTH AT BEDTIME  90 tablet  3   No facility-administered medications prior to visit.    Review of Systems:   GEN: No acute illnesses, no fevers, chills. GI: No n/v/d, eating normally Pulm: No SOB Interactive and getting along well at home.  Otherwise, ROS is as per the HPI.   Physical Examination: BP 128/78  Pulse 77  Temp(Src) 98.4 F (36.9 C) (Oral)  Ht 5' 9.5" (1.765 m)  Wt 204 lb 4 oz (92.647 kg)  BMI 29.74 kg/m2  Ideal Body Weight: Weight in (lb) to have BMI = 25: 171.4   GEN: WDWN, NAD, Non-toxic, A & O x 3 HEENT: Atraumatic, Normocephalic. Neck supple. No masses, No LAD. Ears and Nose: No external deformity. CV: RRR, No M/G/R. No JVD. No thrill. No extra heart sounds. PULM: CTA B, no wheezes, crackles, rhonchi. No retractions. No resp. distress. No accessory muscle use. EXTR: No c/c/e NEURO Normal gait.  PSYCH: Normally interactive.  Conversant. Not depressed or anxious appearing.  Calm demeanor.    Assessment and Plan:  HYPERTENSION  HYPERLIPIDEMIA  At this point the patient declines all further lab testing and services due to finances and lack of insurance.  Orders Today:  No orders of the defined types were placed in this encounter.    Updated Medication List: (Includes new medications, updates to list, dose adjustments) Meds ordered this encounter  Medications  . Red Yeast Rice Extract (RED YEAST RICE PO)    Sig: Take by mouth. Take 1200 mg by mouth two times a day  . FLUoxetine (PROZAC) 20 MG tablet    Sig: Take 1 tablet (20 mg total) by mouth daily.    Dispense:  30 tablet    Refill:  5  . losartan (COZAAR) 50 MG tablet    Sig: Take 1 tablet (50 mg total) by mouth daily.    Dispense:  90 tablet    Refill:   3    Medications Discontinued: Medications Discontinued During This Encounter  Medication Reason  . pravastatin (PRAVACHOL) 40 MG tablet   . losartan (COZAAR) 50 MG tablet Reorder      Signed, Karleen Hampshire T. Tierney Behl, MD 05/10/2013 9:42 AM

## 2013-10-26 ENCOUNTER — Other Ambulatory Visit: Payer: Self-pay | Admitting: Family Medicine

## 2014-01-24 ENCOUNTER — Other Ambulatory Visit: Payer: Self-pay | Admitting: Family Medicine

## 2014-01-25 ENCOUNTER — Other Ambulatory Visit: Payer: Self-pay | Admitting: Family Medicine

## 2014-02-26 ENCOUNTER — Telehealth: Payer: Self-pay | Admitting: Family Medicine

## 2014-02-26 NOTE — Telephone Encounter (Signed)
Mr. Schiele notified refills have been sent in to his pharmacy.

## 2014-02-26 NOTE — Telephone Encounter (Signed)
Ok to ref 30, 4 ref  cpx in th efall

## 2014-02-26 NOTE — Telephone Encounter (Signed)
Pt left v/m requesting cb when fluoxetine is refilled.

## 2014-02-26 NOTE — Telephone Encounter (Signed)
Last office visit 05/10/2013.  Ok to refill?

## 2014-04-09 ENCOUNTER — Other Ambulatory Visit: Payer: Self-pay | Admitting: Family Medicine

## 2014-04-09 DIAGNOSIS — Z79899 Other long term (current) drug therapy: Secondary | ICD-10-CM

## 2014-04-09 DIAGNOSIS — Z125 Encounter for screening for malignant neoplasm of prostate: Secondary | ICD-10-CM

## 2014-04-09 DIAGNOSIS — Z85038 Personal history of other malignant neoplasm of large intestine: Secondary | ICD-10-CM

## 2014-04-09 DIAGNOSIS — Z13220 Encounter for screening for lipoid disorders: Secondary | ICD-10-CM

## 2014-04-10 ENCOUNTER — Other Ambulatory Visit (INDEPENDENT_AMBULATORY_CARE_PROVIDER_SITE_OTHER): Payer: Medicare Other

## 2014-04-10 DIAGNOSIS — Z85038 Personal history of other malignant neoplasm of large intestine: Secondary | ICD-10-CM | POA: Diagnosis not present

## 2014-04-10 DIAGNOSIS — Z79899 Other long term (current) drug therapy: Secondary | ICD-10-CM

## 2014-04-10 DIAGNOSIS — Z13220 Encounter for screening for lipoid disorders: Secondary | ICD-10-CM | POA: Diagnosis not present

## 2014-04-10 DIAGNOSIS — Z125 Encounter for screening for malignant neoplasm of prostate: Secondary | ICD-10-CM

## 2014-04-10 LAB — BASIC METABOLIC PANEL
BUN: 12 mg/dL (ref 6–23)
CALCIUM: 9.6 mg/dL (ref 8.4–10.5)
CO2: 24 mEq/L (ref 19–32)
CREATININE: 1 mg/dL (ref 0.4–1.5)
Chloride: 103 mEq/L (ref 96–112)
GFR: 76.18 mL/min (ref >60.00)
Glucose, Bld: 102 mg/dL — ABNORMAL HIGH (ref 70–99)
Potassium: 4.1 mEq/L (ref 3.5–5.1)
Sodium: 137 mEq/L (ref 135–145)

## 2014-04-10 LAB — PSA: PSA: 4.57 ng/mL — ABNORMAL HIGH (ref 0.10–4.00)

## 2014-04-10 LAB — CBC WITH DIFFERENTIAL/PLATELET
BASOS ABS: 0 10*3/uL (ref 0.0–0.1)
Basophils Relative: 0.4 % (ref 0.0–3.0)
Eosinophils Absolute: 0.1 10*3/uL (ref 0.0–0.7)
Eosinophils Relative: 1.3 % (ref 0.0–5.0)
HCT: 44.2 % (ref 39.0–52.0)
Hemoglobin: 14.8 g/dL (ref 13.0–17.0)
LYMPHS PCT: 18.2 % (ref 12.0–46.0)
Lymphs Abs: 1.1 10*3/uL (ref 0.7–4.0)
MCHC: 33.4 g/dL (ref 30.0–36.0)
MCV: 86.3 fl (ref 78.0–100.0)
MONO ABS: 0.5 10*3/uL (ref 0.1–1.0)
Monocytes Relative: 8.4 % (ref 3.0–12.0)
NEUTROS PCT: 71.7 % (ref 43.0–77.0)
Neutro Abs: 4.1 10*3/uL (ref 1.4–7.7)
PLATELETS: 202 10*3/uL (ref 150.0–400.0)
RBC: 5.12 Mil/uL (ref 4.22–5.81)
RDW: 14 % (ref 11.5–15.5)
WBC: 5.8 10*3/uL (ref 4.0–10.5)

## 2014-04-10 LAB — HEPATIC FUNCTION PANEL
ALT: 25 U/L (ref 0–53)
AST: 23 U/L (ref 0–37)
Albumin: 4.3 g/dL (ref 3.5–5.2)
Alkaline Phosphatase: 60 U/L (ref 39–117)
BILIRUBIN TOTAL: 0.5 mg/dL (ref 0.2–1.2)
Bilirubin, Direct: 0.1 mg/dL (ref 0.0–0.3)
TOTAL PROTEIN: 7.1 g/dL (ref 6.0–8.3)

## 2014-04-10 LAB — LIPID PANEL
CHOLESTEROL: 238 mg/dL — AB (ref 0–200)
HDL: 43.5 mg/dL (ref >39.00)
LDL Cholesterol: 161 mg/dL — ABNORMAL HIGH (ref 0–99)
NonHDL: 194.5
Total CHOL/HDL Ratio: 5
Triglycerides: 166 mg/dL — ABNORMAL HIGH (ref 0.0–149.0)
VLDL: 33.2 mg/dL (ref 0.0–40.0)

## 2014-04-11 ENCOUNTER — Other Ambulatory Visit: Payer: Self-pay

## 2014-04-11 LAB — CEA: CEA: 1.6 ng/mL (ref 0.0–5.0)

## 2014-04-18 ENCOUNTER — Encounter: Payer: Self-pay | Admitting: Family Medicine

## 2014-04-18 ENCOUNTER — Ambulatory Visit (INDEPENDENT_AMBULATORY_CARE_PROVIDER_SITE_OTHER): Payer: Medicare Other | Admitting: Family Medicine

## 2014-04-18 VITALS — BP 112/70 | HR 79 | Temp 98.2°F | Ht 68.5 in | Wt 197.2 lb

## 2014-04-18 DIAGNOSIS — Z85038 Personal history of other malignant neoplasm of large intestine: Secondary | ICD-10-CM

## 2014-04-18 DIAGNOSIS — Z0000 Encounter for general adult medical examination without abnormal findings: Secondary | ICD-10-CM

## 2014-04-18 DIAGNOSIS — M25561 Pain in right knee: Secondary | ICD-10-CM

## 2014-04-18 DIAGNOSIS — R972 Elevated prostate specific antigen [PSA]: Secondary | ICD-10-CM | POA: Diagnosis not present

## 2014-04-18 DIAGNOSIS — E785 Hyperlipidemia, unspecified: Secondary | ICD-10-CM

## 2014-04-18 DIAGNOSIS — R454 Irritability and anger: Secondary | ICD-10-CM | POA: Insufficient documentation

## 2014-04-18 DIAGNOSIS — I10 Essential (primary) hypertension: Secondary | ICD-10-CM

## 2014-04-18 DIAGNOSIS — Z125 Encounter for screening for malignant neoplasm of prostate: Secondary | ICD-10-CM | POA: Diagnosis not present

## 2014-04-18 MED ORDER — FLUOXETINE HCL 20 MG PO CAPS: 20.0000 mg | ORAL_CAPSULE | Freq: Every day | ORAL | Status: DC

## 2014-04-18 MED ORDER — LOSARTAN POTASSIUM 50 MG PO TABS: 50.0000 mg | ORAL_TABLET | Freq: Every day | ORAL | Status: DC

## 2014-04-18 MED ORDER — ZOSTER VACCINE LIVE 19400 UNT/0.65ML ~~LOC~~ SOLR: 0.6500 mL | Freq: Once | SUBCUTANEOUS | Status: DC

## 2014-04-18 NOTE — Progress Notes (Signed)
Pre visit review using our clinic review tool, if applicable. No additional management support is needed unless otherwise documented below in the visit note. 

## 2014-04-18 NOTE — Patient Instructions (Signed)
REFERRALS TO SPECIALISTS, SPECIAL TESTS (MRI, CT, ULTRASOUNDS)  GO THE WAITING ROOM AND TELL CHECK IN YOU NEED HELP WITH A REFERRAL. Either MARION or LINDA will help you set it up.  If it is between 1-2 PM they may be at lunch.  After 5 PM, they will likely be at home.  They will call you, so please make sure the office has your correct phone number.  Referrals sometimes can be done same day if urgent, but others can take 2 or 3 days to get an appointment. Starting in 2015, many of the new Medicare insurance plans and Affordable Care Act (Obamacare) Health plans offered take much longer for referrals. They have added additional paperwork and steps.  MRI's and CT's can take up to a week for the test. (Emergencies like strokes take precedence. I will tell you if you have an emergency.)   If your referral is to an in-network [place] office, their office may contact you directly prior to our office reaching you.  -- Examples: [facility], [facility], [facility], [facility], [facility], and many more.  Specialist appointment times vary a great deal, mostly on the specialist's schedule and if they have openings. -- Our office tries to get you in as fast as possible. -- Some specialists have very long wait times. (Example. Dermatology. Usually months) -- If you have a true emergency like new cancer, we work to get you in ASAP.   

## 2014-04-18 NOTE — Progress Notes (Signed)
[place] [address] Phone: [phone] Fax: [phone]  Patient ID: Randy Boyle MRN: [id number], DOB: Feb 09, 1951, 63 y.o. Date of Encounter: 04/18/2014  Primary Physician:  Owens Loffler, MD   Chief Complaint: Annual Exam  Subjective:   History of Present Illness:  Randy Boyle is a 63 y.o. pleasant patient who presents for a medicare wellness examination:  Welcome to Medicare, and several acute issues.  Health Maintenance Summary Reviewed and updated, unless pt declines services.  Tobacco History Reviewed. Alcohol: No concerns, no excessive use Exercise Habits: Regularly walks STD concerns: no risk or activity to increase risk Drug Use: None Encouraged self-testicular check  Health Maintenance  Topic Date Due  . Zostavax  05/13/2014 (Originally 04/19/2009)  . Influenza Vaccine  04/21/2014  . Colonoscopy  04/26/2015  . Tetanus/tdap  03/09/2018    Immunization History  Administered Date(s) Administered  . Td 03/09/2008   Zostavax? Needs  F/u colon CA F/u Dr. Fuller Plan, most recent colonoscopy Lab Results  Component Value Date   CEA 1.6 04/10/2014    Irritable / depression / anger. Was some irritable last year - none for about a whole year. Feels so great being on prozac.  Knees are bothering him - mostly all the right. He has been bothering him for about 7 or 8 years. He had an injury at that time he did the medial aspect of his knee. Since then her last for 5 years he has had what feels to be a calcification form on the caudal aspect of the medial aspect of the knee. It does bother him every once in a while. We did obtain some films couple of years ago, and it does look as if he has only some minimal to mild osteoarthritic change, he does have a calcification is present on the medial aspect. He raises his question again.  Clinical Data: Right knee pain  BILATERAL KNEES STANDING - 1 VIEW  Comparison: None.  Findings: Standing views of the knees show  the joint spaces to be  normal. No acute bony abnormality is seen. No chondrocalcinosis  is noted. Alignment is normal.  IMPRESSION:  Negative standing views of the knees.  Original Report Authenticated By: Joretta Bachelor, M.D.  *RADIOLOGY REPORT*  Clinical Data: Right knee pain  RIGHT KNEE - 1-2 VIEW  Comparison: None.  Findings: Two views of the right knee show no fracture. No joint  effusion is seen. Minimal degenerative spurring from the patella  is noted.  IMPRESSION:  Minimal degenerative change. No acute abnormality.  Original Report Authenticated By: Joretta Bachelor, M.D.  The patient is asymptomatic, but he does have an elevated prostate-specific antigen. This is most recently been checked about 2 years ago, and he has had more than double his prostate-specific antigen in that time. He is a 63 year old male, nice prostate-specific antigen is greater than 4.5. Urology consult.    Patient Active Problem List   Diagnosis Date Noted  . Routine general medical examination at a health care facility 01/01/2011  . Personal history of colon cancer, stage II   . OTHER MALAISE AND FATIGUE 03/18/2009  . HYPERLIPIDEMIA 01/01/2009  . HYPERTENSION 01/01/2009  . DIVERTICULITIS, HX OF 01/01/2009   Past Medical History  Diagnosis Date  . HTN (hypertension)   . HLD (hyperlipidemia)   . Diverticulitis   . Personal history of colon cancer, stage II     Adenocarcinoma  . Hx of pancreatitis    Past Surgical History  Procedure  Laterality Date  . Hemicolectomy  07/2006   History   Social History  . Marital Status: Married    Spouse Name: N/A    Number of Children: 2  . Years of Education: N/A   Occupational History  . Retired    Social History Main Topics  . Smoking status: Never Smoker   . Smokeless tobacco: Never Used  . Alcohol Use: Yes     Comment: beer occassionally  . Drug Use: No  . Sexual Activity: Not on file   Other Topics Concern  . Not on file   Social  History Narrative   [employer]   Family History  Problem Relation Age of Onset  . Colon cancer Neg Hx    Allergies  Allergen Reactions  . Oxycodone-Acetaminophen     REACTION: hallucinations  . Penicillins     REACTION: reacted as a child   Medication list has been reviewed and updated.  Review of Systems:  General: Denies fever, chills, sweats. No significant weight loss. Eyes: Denies blurring,significant itching ENT: Denies earache, sore throat, and hoarseness. Cardiovascular: Denies chest pains, palpitations, dyspnea on exertion Respiratory: Denies cough, dyspnea at rest,wheeezing Breast: no concerns about lumps GI: Denies nausea, vomiting, diarrhea, constipation, change in bowel habits, abdominal pain, melena, hematochezia GU: Denies penile discharge, occ ED, urinary flow / outflow problems. No STD concerns. Musculoskeletal: as above Derm: Denies rash, itching Neuro: Denies  paresthesias, frequent falls, frequent headaches Psych: Denies depression, anxiety Endocrine: Denies cold intolerance, heat intolerance, polydipsia Heme: Denies enlarged lymph nodes Allergy: No hayfever  Objective:   Physical Examination: BP 112/70  Pulse 79  Temp(Src) 98.2 F (36.8 C) (Oral)  Ht 5' 8.5" (1.74 m)  Wt 197 lb 4 oz (89.472 kg)  BMI 29.55 kg/m2  SpO2 97%  The patient completed a fall screen and PHQ-2 and PHQ-9 if necessary, which is documented in the EHR. The CMA/LPN/RN who assisted the patient verbally completed with them and documented results in [facility].   Hearing Screening   Method: Audiometry   '125Hz'  '250Hz'  '500Hz'  '1000Hz'  '2000Hz'  '4000Hz'  '8000Hz'   Right ear:   '20 25 20 ' 0   Left ear:   40 40 20 40   Vision Screening Comments: Wears Glasses-Seen 01/23/2014 at [facility] in [place].  GEN: well developed, well nourished, no acute distress Eyes: conjunctiva and lids normal, PERRLA, EOMI ENT: TM clear, nares clear, oral exam WNL Neck: supple, no  lymphadenopathy, no thyromegaly, no JVD Pulm: clear to auscultation and percussion, respiratory effort normal CV: regular rate and rhythm, S1-S2, no murmur, rub or gallop, no bruits, peripheral pulses normal and symmetric, no cyanosis, clubbing, edema or varicosities GI: soft, non-tender; no hepatosplenomegaly, masses; active bowel sounds all quadrants GU: no hernia, testicular mass, penile discharge. Prostate is mildly enlarged without nodules. Lymph: no cervical, axillary or inguinal adenopathy MSK: gait normal, muscle tone and strength WNL, no joint swelling, effusions, discoloration, crepitus   Bilateral knees: Full extension. Flexion to 125. No tenderness on the medial or lateral joint line. Medial collateral ligament and lateral collateral ligament stable bilaterally. Lachman is negative. Posterior drawer is negative. McMurray's is negative. Flexion pinch testing is negative. Balance testing is negative. On the medial knee caudal to the joint line there is a palpable calcification it is minimally tender to palpation.  SKIN: clear, good turgor, color WNL, no rashes, lesions, or ulcerations Neuro: normal mental status, normal strength, sensation, and motion Psych: alert; oriented to person, place and time,  normally interactive and not anxious or depressed in appearance.  All labs reviewed with patient.  Lipids:    Component Value Date/Time   CHOL 238* 04/10/2014 0834   TRIG 166.0* 04/10/2014 0834   HDL 43.50 04/10/2014 0834   VLDL 33.2 04/10/2014 0834   CHOLHDL 5 04/10/2014 0834   CBC: CBC Latest Ref Rng 04/10/2014 12/28/2011 12/29/2010  WBC 4.0 - 10.5 K/uL 5.8 8.3 6.4  Hemoglobin 13.0 - 17.0 g/dL 14.8 15.3 15.6  Hematocrit 39.0 - 52.0 % 44.2 45.3 46.1  Platelets 150.0 - 400.0 K/uL 202.0 183.0 643.8    Basic Metabolic Panel:    Component Value Date/Time   NA 137 04/10/2014 0834   K 4.1 04/10/2014 0834   CL 103 04/10/2014 0834   CO2 24 04/10/2014 0834   BUN 12 04/10/2014 0834    CREATININE 1.0 04/10/2014 0834   GLUCOSE 102* 04/10/2014 0834   CALCIUM 9.6 04/10/2014 0834   Hepatic Function Latest Ref Rng 04/10/2014 12/28/2011 12/29/2010  Total Protein 6.0 - 8.3 g/dL 7.1 7.5 7.7  Albumin 3.5 - 5.2 g/dL 4.3 4.9 4.8  AST 0 - 37 U/L '23 27 26  ' ALT 0 - 53 U/L 25 37 29  Alk Phosphatase 39 - 117 U/L 60 60 60  Total Bilirubin 0.2 - 1.2 mg/dL 0.5 0.5 0.8  Bilirubin, Direct 0.0 - 0.3 mg/dL 0.1 0.1 0.1    Lab Results  Component Value Date   TSH 1.86 12/29/2010   Lab Results  Component Value Date   PSA 4.57* 04/10/2014   PSA 1.78 12/28/2011   PSA 1.79 12/29/2010    Assessment & Plan:  Routine general medical examination at a health care facility  Elevated PSA - Plan: Ambulatory referral to Urology: [facility] urology for their opinion. Potential ultrasound and biopsy if they feel appropriate.  HYPERTENSION, stable  HYPERLIPIDEMIA: Rec going back on his red yeast rice  Personal history of colon cancer, stage II: CEA stable, keep up with GI  Knee pain, right. Reassured. I am not concerned at all. He likely does have some mild arthritis, but I don't really suspect any kind of significant internal derangement. I tried to reassure the patient. I would not recommend any further intervention at all.  Irritability: cont prozac, doing great.   Health Maintenance Exam: The patient's preventative maintenance and recommended screening tests for an annual wellness exam were reviewed in full today. Brought up to date unless services declined.  Counselled on the importance of diet, exercise, and its role in overall health and mortality. The patient's FH and SH was reviewed, including their home life, tobacco status, and drug and alcohol status.  I have personally reviewed the Medicare Annual Wellness questionnaire and have noted 1. The patient's medical and social history 2. Their use of alcohol, tobacco or illicit drugs 3. Their current medications and supplements 4. The patient's  functional ability including ADL's, fall risks, home safety risks and hearing or visual             impairment. 5. Diet and physical activities 6. Evidence for depression or mood disorders  The patients weight, height, BMI and visual acuity have been recorded in the chart I have made referrals, counseling and provided education to the patient based review of the above and I have provided the pt with a written personalized care plan for preventive services.  I have provided the patient with a copy of your personalized plan for preventive services. Instructed to take the time to review along  with their updated medication list.  Follow-up: No Follow-up on file. Or follow-up in 1 year for complete physical examination  New Prescriptions   ZOSTER VACCINE LIVE, PF, (ZOSTAVAX) 79892 UNT/0.65ML INJECTION    Inject 19,400 Units into the skin once.   Modified Medications   Modified Medication Previous Medication   FLUOXETINE (PROZAC) 20 MG CAPSULE FLUoxetine (PROZAC) 20 MG capsule      Take 1 capsule (20 mg total) by mouth daily.    Take 1 capsule (20 mg total) by mouth daily.   LOSARTAN (COZAAR) 50 MG TABLET losartan (COZAAR) 50 MG tablet      Take 1 tablet (50 mg total) by mouth daily.    Take 1 tablet (50 mg total) by mouth daily.   Orders Placed This Encounter  Procedures  . Ambulatory referral to Urology    Signed,  Frederico Hamman T. Mick Tanguma, MD, [facility] Sports Medicine   Discontinued Medications   ASPIRIN 81 MG TABLET    Take 81 mg by mouth daily.     RED YEAST RICE EXTRACT (RED YEAST RICE PO)    Take by mouth. Take 1200 mg by mouth two times a day   Current Medications at Discharge:   Medication List       This list is accurate as of: 04/18/14  3:47 PM.  Always use your most recent med list.               FLUoxetine 20 MG capsule  Commonly known as:  PROZAC  Take 1 capsule (20 mg total) by mouth daily.     losartan 50 MG tablet  Commonly known as:  COZAAR  Take 1 tablet (50 mg  total) by mouth daily.     zoster vaccine live (PF) 19400 UNT/0.65ML injection  Commonly known as:  ZOSTAVAX  Inject 19,400 Units into the skin once.

## 2014-04-27 DIAGNOSIS — R972 Elevated prostate specific antigen [PSA]: Secondary | ICD-10-CM | POA: Diagnosis not present

## 2014-05-22 DIAGNOSIS — R972 Elevated prostate specific antigen [PSA]: Secondary | ICD-10-CM | POA: Diagnosis not present

## 2015-03-18 ENCOUNTER — Encounter: Payer: Self-pay | Admitting: Gastroenterology

## 2015-05-13 ENCOUNTER — Other Ambulatory Visit: Payer: Self-pay | Admitting: Family Medicine

## 2015-05-13 NOTE — Telephone Encounter (Signed)
Please call and schedule CPE with fasting labs prior for Dr. Copland.  

## 2015-05-14 ENCOUNTER — Other Ambulatory Visit: Payer: Self-pay | Admitting: Family Medicine

## 2015-07-15 ENCOUNTER — Other Ambulatory Visit: Payer: Self-pay | Admitting: Family Medicine

## 2015-07-15 NOTE — Telephone Encounter (Signed)
Last office visit 04/18/2014.  No future appointments scheduled. Last refilled 04/18/2014 for #90 with 3 refills. Ok to refill?

## 2015-07-16 ENCOUNTER — Other Ambulatory Visit: Payer: Self-pay | Admitting: Family Medicine

## 2015-08-05 ENCOUNTER — Other Ambulatory Visit: Payer: Self-pay | Admitting: Family Medicine

## 2015-08-05 NOTE — Telephone Encounter (Signed)
Last office visit 04/18/2014.  Last refilled 0822/2016 for #90 with 0 refills. No future appointments scheduled. Ok to refill?

## 2015-08-06 ENCOUNTER — Other Ambulatory Visit: Payer: Self-pay | Admitting: Family Medicine

## 2015-08-09 ENCOUNTER — Other Ambulatory Visit: Payer: Self-pay

## 2015-08-09 MED ORDER — LOSARTAN POTASSIUM 50 MG PO TABS: 50.0000 mg | ORAL_TABLET | Freq: Every day | ORAL | Status: DC

## 2015-08-09 NOTE — Telephone Encounter (Signed)
Pt left v/m requesting refill losartan to [facility] [address]; pt scheduled med refill appt 08/19/15. Refilled # 30 x 0. Left message on phone due to Fri afternoon for pt to ck with pharmacy.

## 2015-08-19 ENCOUNTER — Encounter: Payer: Self-pay | Admitting: Gastroenterology

## 2015-08-19 ENCOUNTER — Ambulatory Visit (INDEPENDENT_AMBULATORY_CARE_PROVIDER_SITE_OTHER): Payer: Medicare Other | Admitting: Family Medicine

## 2015-08-19 ENCOUNTER — Encounter: Payer: Self-pay | Admitting: Family Medicine

## 2015-08-19 VITALS — BP 122/78 | HR 83 | Temp 98.0°F | Ht 68.5 in | Wt 203.0 lb

## 2015-08-19 DIAGNOSIS — Z85038 Personal history of other malignant neoplasm of large intestine: Secondary | ICD-10-CM

## 2015-08-19 DIAGNOSIS — E785 Hyperlipidemia, unspecified: Secondary | ICD-10-CM

## 2015-08-19 DIAGNOSIS — I10 Essential (primary) hypertension: Secondary | ICD-10-CM | POA: Diagnosis not present

## 2015-08-19 DIAGNOSIS — R454 Irritability and anger: Secondary | ICD-10-CM | POA: Diagnosis not present

## 2015-08-19 MED ORDER — LOSARTAN POTASSIUM 50 MG PO TABS: 50.0000 mg | ORAL_TABLET | Freq: Every day | ORAL | Status: DC

## 2015-08-19 MED ORDER — FLUOXETINE HCL 20 MG PO CAPS: 20.0000 mg | ORAL_CAPSULE | Freq: Every day | ORAL | Status: DC

## 2015-08-19 NOTE — Patient Instructions (Signed)

## 2015-08-19 NOTE — Progress Notes (Signed)
Dr. Frederico Hamman T. Austine Wiedeman, MD, [facility] [place], [address] Phone: [phone] Fax: [phone]  08/19/2015  Patient: Randy Boyle, MRN: [id number], DOB: 1951-02-03, 64 y.o.  Primary Physician:  Owens Loffler, MD   Chief Complaint  Patient presents with  . Medication Refill   Subjective:   Randy Boyle is a 64 y.o. very pleasant male patient who presents with the following:  Prozac has changed his life.  No more anger, no irritability.  Never raising voice.   HTN: Tolerating all medications without side effects Stable and at goal No CP, no sob. No HA.  BP Readings from Last 3 Encounters:  08/19/15 122/78  04/18/14 112/70  05/10/13 [id number]    Basic Metabolic Panel:    Component Value Date/Time   NA 137 04/10/2014 0834   K 4.1 04/10/2014 0834   CL 103 04/10/2014 0834   CO2 24 04/10/2014 0834   BUN 12 04/10/2014 0834   CREATININE 1.0 04/10/2014 0834   GLUCOSE 102* 04/10/2014 0834   CALCIUM 9.6 04/10/2014 0834   Needs colon - h/o colon ca  Past Medical History, Surgical History, Social History, Family History, Problem List, Medications, and Allergies have been reviewed and updated if relevant.  Patient Active Problem List   Diagnosis Date Noted  . Irritability and anger 04/18/2014  . Personal history of colon cancer, stage II   . OTHER MALAISE AND FATIGUE 03/18/2009  . HYPERLIPIDEMIA 01/01/2009  . HYPERTENSION 01/01/2009  . DIVERTICULITIS, HX OF 01/01/2009    Past Medical History  Diagnosis Date  . HTN (hypertension)   . HLD (hyperlipidemia)   . Diverticulitis   . Personal history of colon cancer, stage II     Adenocarcinoma  . Hx of pancreatitis     Past Surgical History  Procedure Laterality Date  . Hemicolectomy  07/2006    Social History   Social History  . Marital Status: Married    Spouse Name: N/A  . Number of Children: 2  . Years of Education: N/A   Occupational History  .  Retired    Social History Main Topics  . Smoking status: Never Smoker   . Smokeless tobacco: Never Used  . Alcohol Use: Yes     Comment: beer occassionally  . Drug Use: No  . Sexual Activity: Not on file   Other Topics Concern  . Not on file   Social History Narrative   [employer]    Family History  Problem Relation Age of Onset  . Colon cancer Neg Hx     Allergies  Allergen Reactions  . Oxycodone-Acetaminophen     REACTION: hallucinations  . Penicillins     REACTION: reacted as a child    Medication list reviewed and updated in full in [facility] [place].   GEN: No acute illnesses, no fevers, chills. GI: No n/v/d, eating normally Pulm: No SOB Interactive and getting along well at home.  Otherwise, ROS is as per the HPI.  Objective:   BP 122/78 mmHg  Pulse 83  Temp(Src) 98 F (36.7 C) (Oral)  Ht 5' 8.5" (1.74 m)  Wt 203 lb (92.08 kg)  BMI 30.41 kg/m2  GEN: WDWN, NAD, Non-toxic, A & O x 3 HEENT: Atraumatic, Normocephalic. Neck supple. No masses, No LAD. Ears and Nose: No external deformity. CV: RRR, No M/G/R. No JVD. No thrill. No extra heart sounds. PULM: CTA B, no wheezes, crackles, rhonchi. No retractions. No resp. distress.  No accessory muscle use. EXTR: No c/c/e NEURO Normal gait.  PSYCH: Normally interactive. Conversant. Not depressed or anxious appearing.  Calm demeanor.   Laboratory and Imaging Data:  Assessment and Plan:   Essential hypertension  Personal history of colon cancer, stage II - Plan: Ambulatory referral to Gastroenterology  Hyperlipidemia LDL goal <70  Irritability and anger   Refill meds Refer for colon  Declines labs and other services The patient declines routine health maintenance services noted. We reviewed that could lead to missing significant problems that could affect there mortality. The patient indicated that they understood this and was willing to accept those risks.   Follow-up: No Follow-up on  file.  New Prescriptions   No medications on file   Modified Medications   Modified Medication Previous Medication   FLUOXETINE (PROZAC) 20 MG CAPSULE FLUoxetine (PROZAC) 20 MG capsule      Take 1 capsule (20 mg total) by mouth daily.    TAKE ONE CAPSULE BY MOUTH ONCE DAILY   LOSARTAN (COZAAR) 50 MG TABLET losartan (COZAAR) 50 MG tablet      Take 1 tablet (50 mg total) by mouth daily.    Take 1 tablet (50 mg total) by mouth daily.   Orders Placed This Encounter  Procedures  . Ambulatory referral to Gastroenterology    Signed,  Frederico Hamman T. Macayla Ekdahl, MD   Patient's Medications  New Prescriptions   No medications on file  Previous Medications   No medications on file  Modified Medications   Modified Medication Previous Medication   FLUOXETINE (PROZAC) 20 MG CAPSULE FLUoxetine (PROZAC) 20 MG capsule      Take 1 capsule (20 mg total) by mouth daily.    TAKE ONE CAPSULE BY MOUTH ONCE DAILY   LOSARTAN (COZAAR) 50 MG TABLET losartan (COZAAR) 50 MG tablet      Take 1 tablet (50 mg total) by mouth daily.    Take 1 tablet (50 mg total) by mouth daily.  Discontinued Medications   ZOSTER VACCINE LIVE, PF, (ZOSTAVAX) 16109 UNT/0.65ML INJECTION    Inject 19,400 Units into the skin once.

## 2015-08-19 NOTE — Progress Notes (Signed)
Pre visit review using our clinic review tool, if applicable. No additional management support is needed unless otherwise documented below in the visit note. 

## 2015-10-25 ENCOUNTER — Ambulatory Visit (AMBULATORY_SURGERY_CENTER): Payer: Self-pay

## 2015-10-25 VITALS — Ht 69.0 in | Wt 204.8 lb

## 2015-10-25 DIAGNOSIS — C189 Malignant neoplasm of colon, unspecified: Secondary | ICD-10-CM

## 2015-10-25 MED ORDER — SUPREP BOWEL PREP KIT 17.5-3.13-1.6 GM/177ML PO SOLN: 1.0000 | Freq: Once | ORAL | Status: DC

## 2015-10-25 NOTE — Progress Notes (Signed)
No allergies to eggs or soy No past problems with anesthesia No diet/weight loss meds No home oxygen  Has internet; refused emmi 

## 2015-11-08 ENCOUNTER — Ambulatory Visit (AMBULATORY_SURGERY_CENTER): Payer: Medicare Other | Admitting: Gastroenterology

## 2015-11-08 ENCOUNTER — Encounter: Payer: Self-pay | Admitting: Gastroenterology

## 2015-11-08 VITALS — BP 111/55 | HR 84 | Temp 96.9°F | Resp 22 | Ht 69.0 in | Wt 204.0 lb

## 2015-11-08 DIAGNOSIS — K635 Polyp of colon: Secondary | ICD-10-CM | POA: Diagnosis not present

## 2015-11-08 DIAGNOSIS — I10 Essential (primary) hypertension: Secondary | ICD-10-CM | POA: Diagnosis not present

## 2015-11-08 DIAGNOSIS — D124 Benign neoplasm of descending colon: Secondary | ICD-10-CM

## 2015-11-08 DIAGNOSIS — C189 Malignant neoplasm of colon, unspecified: Secondary | ICD-10-CM

## 2015-11-08 DIAGNOSIS — Z85038 Personal history of other malignant neoplasm of large intestine: Secondary | ICD-10-CM | POA: Diagnosis not present

## 2015-11-08 DIAGNOSIS — F329 Major depressive disorder, single episode, unspecified: Secondary | ICD-10-CM | POA: Diagnosis not present

## 2015-11-08 MED ORDER — SODIUM CHLORIDE 0.9 % IV SOLN: 500.0000 mL | INTRAVENOUS | Status: DC

## 2015-11-08 NOTE — Op Note (Signed)
[place]  [address] [place], [id number]   COLONOSCOPY PROCEDURE REPORT  PATIENT: Randy Boyle, Randy Boyle  MR#: [id number] BIRTHDATE: 1951/08/04 , 79  yrs. old GENDER: male ENDOSCOPIST: Ladene Artist, MD, [facility] PROCEDURE DATE:  11/08/2015 PROCEDURE:   Colonoscopy, surveillance and Colonoscopy with snare polypectomy First Screening Colonoscopy - Avg.  risk and is 50 yrs.  old or older - No.  Prior Negative Screening - Now for repeat screening. N/A  History of Adenoma - Now for follow-up colonoscopy & has been > or = to 3 yrs.  N/A  Polyps removed today? Yes ASA CLASS:   Class II INDICATIONS:Surveillance due to prior colonic neoplasia and PH Colon or Rectal Adenocarcinoma. MEDICATIONS: Monitored anesthesia care and Propofol 200 mg IV DESCRIPTION OF PROCEDURE:   After the risks benefits and alternatives of the procedure were thoroughly explained, informed consent was obtained.  The digital rectal exam revealed no abnormalities of the rectum.   The LB [id number]-H190 [id number]  endoscope was introduced through the anus and advanced to the surgical anastomosis. No adverse events experienced.   The quality of the prep was good.  (Suprep was used)  The instrument was then slowly withdrawn as the colon was fully examined. Estimated blood loss is zero unless otherwise noted in this procedure report.   COLON FINDINGS: A sessile polyp measuring 6 mm in size was found in the descending colon.  A polypectomy was performed with a cold snare.  The resection was complete, the polyp tissue was completely retrieved and sent to histology.   There was moderate diverticulosis noted in the sigmoid colon and descending colon. There was mild diverticulosis noted in the transverse colon. There was evidence of a normal appearing prior surgical anastomosis in the ascending colon.   The examination was otherwise normal. Retroflexed views revealed internal Grade I hemorrhoids. The time to cecum = 2.2  Withdrawal time = 9.2   The scope was withdrawn and the procedure completed. COMPLICATIONS: There were no immediate complications.  ENDOSCOPIC IMPRESSION: 1.   Sessile polyp in the descending colon; polypectomy performed with a cold snare 2.   Moderate diverticulosis in the sigmoid colon and descending colon 3.   Mild diverticulosis in the transverse colon 4.   Prior surgical anastomosis in the ascending colon 5.   Grade l internal hemorrhoids  RECOMMENDATIONS: 1.  Await pathology results 2.  High fiber diet with liberal fluid intake. 3.  Repeat Colonoscopy in 5 years.  eSigned:  Ladene Artist, MD, [facility] 11/08/2015 9:00 AM

## 2015-11-08 NOTE — Progress Notes (Signed)
You may note some irritation in your nose or drainage.  This may cause feelings of congestion.  This is from the oxygen, which can be very drying.  This is no cause for concern and show clear up in a day or so.

## 2015-11-08 NOTE — Patient Instructions (Signed)
YOU HAD AN ENDOSCOPIC PROCEDURE TODAY AT [facility]:   Refer to the procedure report that was given to you for any specific questions about what was found during the examination.  If the procedure report does not answer your questions, please call your gastroenterologist to clarify.  If you requested that your care partner not be given the details of your procedure findings, then the procedure report has been included in a sealed envelope for you to review at your convenience later.  YOU SHOULD EXPECT: Some feelings of bloating in the abdomen. Passage of more gas than usual.  Walking can help get rid of the air that was put into your GI tract during the procedure and reduce the bloating. If you had a lower endoscopy (such as a colonoscopy or flexible sigmoidoscopy) you may notice spotting of blood in your stool or on the toilet paper. If you underwent a bowel prep for your procedure, you may not have a normal bowel movement for a few days.  Please Note:  You might notice some irritation and congestion in your nose or some drainage.  This is from the oxygen used during your procedure.  There is no need for concern and it should clear up in a day or so.  SYMPTOMS TO REPORT IMMEDIATELY:   Following lower endoscopy (colonoscopy or flexible sigmoidoscopy):  Excessive amounts of blood in the stool  Significant tenderness or worsening of abdominal pains  Swelling of the abdomen that is new, acute  Fever of 100F or higher   For urgent or emergent issues, a gastroenterologist can be reached at any hour by calling [phone].   DIET: Your first meal following the procedure should be a small meal and then it is ok to progress to your normal diet. Heavy or fried foods are harder to digest and may make you feel nauseous or bloated.  Likewise, meals heavy in dairy and vegetables can increase bloating.  Drink plenty of fluids but you should avoid alcoholic beverages for 24 hours. Try to  increase the fiber in your diet.  ACTIVITY:  You should plan to take it easy for the rest of today and you should NOT DRIVE or use heavy machinery until tomorrow (because of the sedation medicines used during the test).    FOLLOW UP: Our staff will call the number listed on your records the next business day following your procedure to check on you and address any questions or concerns that you may have regarding the information given to you following your procedure. If we do not reach you, we will leave a message.  However, if you are feeling well and you are not experiencing any problems, there is no need to return our call.  We will assume that you have returned to your regular daily activities without incident.  If any biopsies were taken you will be contacted by phone or by letter within the next 1-3 weeks.  Please call us at [phone] if you have not heard about the biopsies in 3 weeks.    SIGNATURES/CONFIDENTIALITY: You and/or your care partner have signed paperwork which will be entered into your electronic medical record.  These signatures attest to the fact that that the information above on your After Visit Summary has been reviewed and is understood.  Full responsibility of the confidentiality of this discharge information lies with you and/or your care-partner.  Read all handouts given to you by your recovery room nurse.  Thank-you for choosing [place]  for your healthcare needs today.

## 2015-11-08 NOTE — Progress Notes (Signed)
Report to PACU, RN, vss, BBS= Clear.  

## 2015-11-11 ENCOUNTER — Telehealth: Payer: Self-pay | Admitting: *Deleted

## 2015-11-11 NOTE — Telephone Encounter (Signed)
  Follow up Call-  Call back number 11/08/2015  Post procedure Call Back phone  # [phone]  Permission to leave phone message Yes     Patient questions:  Do you have a fever, pain , or abdominal swelling? No. Pain Score  0 *  Have you tolerated food without any problems? Yes.    Have you been able to return to your normal activities? Yes.    Do you have any questions about your discharge instructions: Diet   No. Medications  No. Follow up visit  No.  Do you have questions or concerns about your Care? No.  Actions: * If pain score is 4 or above: No action needed, pain <4.

## 2015-11-13 ENCOUNTER — Encounter: Payer: Self-pay | Admitting: Gastroenterology

## 2015-12-16 ENCOUNTER — Telehealth: Payer: Self-pay | Admitting: Family Medicine

## 2015-12-16 NOTE — Telephone Encounter (Signed)
LM for pt to sch AWV with Katha Cabal, [place]

## 2016-05-14 ENCOUNTER — Telehealth: Payer: Self-pay | Admitting: Family Medicine

## 2016-05-14 NOTE — Telephone Encounter (Signed)
8/24-spoke to pt/declined to schedule AWV for 2107

## 2016-08-11 ENCOUNTER — Telehealth: Payer: Self-pay | Admitting: Family Medicine

## 2016-08-11 DIAGNOSIS — R972 Elevated prostate specific antigen [PSA]: Secondary | ICD-10-CM

## 2016-08-11 NOTE — Telephone Encounter (Signed)
LVM for pt to call back and schedule AWV + labs with PCP.

## 2016-08-19 ENCOUNTER — Ambulatory Visit (INDEPENDENT_AMBULATORY_CARE_PROVIDER_SITE_OTHER): Payer: Medicare Other | Admitting: Family Medicine

## 2016-08-19 ENCOUNTER — Encounter: Payer: Self-pay | Admitting: Family Medicine

## 2016-08-19 VITALS — HR 97 | Temp 99.1°F | Ht 68.5 in | Wt 196.2 lb

## 2016-08-19 DIAGNOSIS — N400 Enlarged prostate without lower urinary tract symptoms: Secondary | ICD-10-CM

## 2016-08-19 DIAGNOSIS — Z85038 Personal history of other malignant neoplasm of large intestine: Secondary | ICD-10-CM

## 2016-08-19 DIAGNOSIS — Z0000 Encounter for general adult medical examination without abnormal findings: Secondary | ICD-10-CM | POA: Diagnosis not present

## 2016-08-19 DIAGNOSIS — E785 Hyperlipidemia, unspecified: Secondary | ICD-10-CM

## 2016-08-19 DIAGNOSIS — Z79899 Other long term (current) drug therapy: Secondary | ICD-10-CM | POA: Diagnosis not present

## 2016-08-19 LAB — CBC WITH DIFFERENTIAL/PLATELET
BASOS ABS: 0 10*3/uL (ref 0.0–0.1)
Basophils Relative: 0.1 % (ref 0.0–3.0)
EOS ABS: 0 10*3/uL (ref 0.0–0.7)
Eosinophils Relative: 0.3 % (ref 0.0–5.0)
HEMATOCRIT: 44.8 % (ref 39.0–52.0)
HEMOGLOBIN: 15.3 g/dL (ref 13.0–17.0)
LYMPHS PCT: 6.9 % — AB (ref 12.0–46.0)
Lymphs Abs: 1.1 10*3/uL (ref 0.7–4.0)
MCHC: 34.2 g/dL (ref 30.0–36.0)
MCV: 84.7 fl (ref 78.0–100.0)
MONOS PCT: 10 % (ref 3.0–12.0)
Monocytes Absolute: 1.6 10*3/uL — ABNORMAL HIGH (ref 0.1–1.0)
NEUTROS ABS: 12.9 10*3/uL — AB (ref 1.4–7.7)
Neutrophils Relative %: 82.7 % — ABNORMAL HIGH (ref 43.0–77.0)
PLATELETS: 245 10*3/uL (ref 150.0–400.0)
RBC: 5.3 Mil/uL (ref 4.22–5.81)
RDW: 13.7 % (ref 11.5–15.5)
WBC: 15.6 10*3/uL — AB (ref 4.0–10.5)

## 2016-08-19 LAB — LIPID PANEL
Cholesterol: 254 mg/dL — ABNORMAL HIGH (ref 0–200)
HDL: 54.5 mg/dL (ref >39.00)
NONHDL: 199.4
Total CHOL/HDL Ratio: 5
Triglycerides: 237 mg/dL — ABNORMAL HIGH (ref 0.0–149.0)
VLDL: 47.4 mg/dL — ABNORMAL HIGH (ref 0.0–40.0)

## 2016-08-19 LAB — PSA: PSA: 5.7 ng/mL — AB (ref 0.10–4.00)

## 2016-08-19 LAB — BASIC METABOLIC PANEL
BUN: 13 mg/dL (ref 6–23)
CALCIUM: 9.7 mg/dL (ref 8.4–10.5)
CHLORIDE: 100 meq/L (ref 96–112)
CO2: 27 meq/L (ref 19–32)
Creatinine, Ser: 1.1 mg/dL (ref 0.40–1.50)
GFR: 70.89 mL/min (ref >60.00)
Glucose, Bld: 112 mg/dL — ABNORMAL HIGH (ref 70–99)
Potassium: 4.3 mEq/L (ref 3.5–5.1)
SODIUM: 137 meq/L (ref 135–145)

## 2016-08-19 LAB — HEPATIC FUNCTION PANEL
ALBUMIN: 4.6 g/dL (ref 3.5–5.2)
ALK PHOS: 75 U/L (ref 39–117)
ALT: 23 U/L (ref 0–53)
AST: 21 U/L (ref 0–37)
BILIRUBIN DIRECT: 0.1 mg/dL (ref 0.0–0.3)
TOTAL PROTEIN: 7.5 g/dL (ref 6.0–8.3)
Total Bilirubin: 0.6 mg/dL (ref 0.2–1.2)

## 2016-08-19 LAB — LDL CHOLESTEROL, DIRECT: Direct LDL: 163 mg/dL

## 2016-08-19 MED ORDER — FLUOXETINE HCL 20 MG PO CAPS: 20.0000 mg | ORAL_CAPSULE | Freq: Every day | ORAL | 3 refills | Status: DC

## 2016-08-19 MED ORDER — LOSARTAN POTASSIUM 50 MG PO TABS: 50.0000 mg | ORAL_TABLET | Freq: Every day | ORAL | 3 refills | Status: DC

## 2016-08-19 NOTE — Progress Notes (Signed)
Pre visit review using our clinic review tool, if applicable. No additional management support is needed unless otherwise documented below in the visit note. 

## 2016-08-19 NOTE — Progress Notes (Signed)
Dr. Frederico Hamman T. Salah Burlison, MD, [facility] [place], [address] Phone: [phone] Fax: [phone]  08/19/2016  Patient: Randy Boyle, MRN: [id number], DOB: [birth year], 65 y.o.  Primary Physician:  Owens Loffler, MD   Chief Complaint  Patient presents with  . Medicare Wellness   Subjective:   Randy Boyle is a 65 y.o. pleasant patient who presents for a medicare wellness examination:  Preventative Health Maintenance Visit:  Health Maintenance Summary Reviewed and updated, unless pt declines services.  Tobacco History Reviewed. Alcohol: No concerns, no excessive use Exercise Habits: Some activity, rec at least 30 mins 5 times a week STD concerns: no risk or activity to increase risk Drug Use: None Encouraged self-testicular check  Health Maintenance  Topic Date Due  . Hepatitis C Screening  04/18/1951  . INFLUENZA VACCINE  12/19/2016 (Originally 04/21/2016)  . ZOSTAVAX  08/19/2017 (Originally 04/19/2009)  . PNA vac Low Risk Adult (1 of 2 - PCV13) 08/19/2017 (Originally 04/19/2014)  . TETANUS/TDAP  03/09/2018  . COLONOSCOPY  11/07/2020    Immunization History  Administered Date(s) Administered  . Td 03/09/2008    Patient Active Problem List   Diagnosis Date Noted  . Irritability and anger 04/18/2014  . Personal history of colon cancer, stage II   . OTHER MALAISE AND FATIGUE 03/18/2009  . Hyperlipidemia LDL goal <70 01/01/2009  . Essential hypertension 01/01/2009  . DIVERTICULITIS, HX OF 01/01/2009   Past Medical History:  Diagnosis Date  . Diverticulitis   . HLD (hyperlipidemia)   . HTN (hypertension)   . Hx of pancreatitis   . Personal history of colon cancer, stage II    Adenocarcinoma   Past Surgical History:  Procedure Laterality Date  . COLONOSCOPY    . HEMICOLECTOMY  07/2006   Social History   Social History  . Marital status: Married    Spouse name: N/A  . Number of children: 2  . Years  of education: N/A   Occupational History  . Retired    Social History Main Topics  . Smoking status: Never Smoker  . Smokeless tobacco: Never Used  . Alcohol use Yes     Comment: beer occassionally  . Drug use: No  . Sexual activity: Not on file   Other Topics Concern  . Not on file   Social History Narrative   [employer]   Family History  Problem Relation Age of Onset  . Colon cancer Neg Hx   . Esophageal cancer Neg Hx   . Rectal cancer Neg Hx   . Stomach cancer Neg Hx    Allergies  Allergen Reactions  . Oxycodone-Acetaminophen     REACTION: hallucinations  . Penicillins     REACTION: reacted as a child    Medication list has been reviewed and updated.   General: Denies fever, chills, sweats. No significant weight loss. Eyes: Denies blurring,significant itching ENT: Denies earache, sore throat, and hoarseness. Cardiovascular: Denies chest pains, palpitations, dyspnea on exertion Respiratory: Denies cough, dyspnea at rest,wheeezing Breast: no concerns about lumps GI: Denies nausea, vomiting, diarrhea, constipation, change in bowel habits, abdominal pain, melena, hematochezia GU: Denies penile discharge, ED, urinary flow / outflow problems. No STD concerns. Musculoskeletal: Denies back pain, joint pain Derm: Denies rash, itching Neuro: Denies  paresthesias, frequent falls, frequent headaches Psych: Denies depression, anxiety Endocrine: Denies cold intolerance, heat intolerance, polydipsia Heme: Denies enlarged lymph nodes Allergy: No hayfever  Objective:   Pulse 97  Temp 99.1 F (37.3 C) (Oral)   Ht 5' 8.5" (1.74 m)   Wt 196 lb 4 oz (89 kg)   BMI 29.41 kg/m   The patient completed a fall screen and PHQ-2 and PHQ-9 if necessary, which is documented in the EHR. The CMA/LPN/RN who assisted the patient verbally completed with them and documented results in [facility].   Hearing Screening   Method: Audiometry   '125Hz'  '250Hz'  '500Hz'  '1000Hz'   '2000Hz'  '3000Hz'  '4000Hz'  '6000Hz'  '8000Hz'   Right ear:   '20 20 20  ' 0    Left ear:   40 0 20  0      Visual Acuity Screening   Right eye Left eye Both eyes  Without correction:     With correction: '20/10 20/10 20/10 '    GEN: well developed, well nourished, no acute distress Eyes: conjunctiva and lids normal, PERRLA, EOMI ENT: TM clear, nares clear, oral exam WNL Neck: supple, no lymphadenopathy, no thyromegaly, no JVD Pulm: clear to auscultation and percussion, respiratory effort normal CV: regular rate and rhythm, S1-S2, no murmur, rub or gallop, no bruits, peripheral pulses normal and symmetric, no cyanosis, clubbing, edema or varicosities GI: soft, non-tender; no hepatosplenomegaly, masses; active bowel sounds all quadrants GU: no hernia, testicular mass, penile discharge Lymph: no cervical, axillary or inguinal adenopathy MSK: gait normal, muscle tone and strength WNL, no joint swelling, effusions, discoloration, crepitus  SKIN: clear, good turgor, color WNL, no rashes, lesions, or ulcerations Neuro: normal mental status, normal strength, sensation, and motion Psych: alert; oriented to person, place and time, normally interactive and not anxious or depressed in appearance.  All labs reviewed with patient.  Lipids:    Component Value Date/Time   CHOL 238 (H) 04/10/2014 0834   TRIG 166.0 (H) 04/10/2014 0834   HDL 43.50 04/10/2014 0834   VLDL 33.2 04/10/2014 0834   CHOLHDL 5 04/10/2014 0834   CBC: CBC Latest Ref Rng & Units 04/10/2014 12/28/2011 12/29/2010  WBC 4.0 - 10.5 K/uL 5.8 8.3 6.4  Hemoglobin 13.0 - 17.0 g/dL 14.8 15.3 15.6  Hematocrit 39.0 - 52.0 % 44.2 45.3 46.1  Platelets 150.0 - 400.0 K/uL 202.0 183.0 973.5    Basic Metabolic Panel:    Component Value Date/Time   NA 137 04/10/2014 0834   K 4.1 04/10/2014 0834   CL 103 04/10/2014 0834   CO2 24 04/10/2014 0834   BUN 12 04/10/2014 0834   CREATININE 1.0 04/10/2014 0834   GLUCOSE 102 (H) 04/10/2014 0834   CALCIUM 9.6  04/10/2014 0834   Hepatic Function Latest Ref Rng & Units 04/10/2014 12/28/2011 12/29/2010  Total Protein 6.0 - 8.3 g/dL 7.1 7.5 7.7  Albumin 3.5 - 5.2 g/dL 4.3 4.9 4.8  AST 0 - 37 U/L '23 27 26  ' ALT 0 - 53 U/L 25 37 29  Alk Phosphatase 39 - 117 U/L 60 60 60  Total Bilirubin 0.2 - 1.2 mg/dL 0.5 0.5 0.8  Bilirubin, Direct 0.0 - 0.3 mg/dL 0.1 0.1 0.1    Lab Results  Component Value Date   TSH 1.86 12/29/2010   Lab Results  Component Value Date   PSA 4.57 (H) 04/10/2014   PSA 1.78 12/28/2011   PSA 1.79 12/29/2010    Assessment and Plan:   Healthcare maintenance  Hyperlipidemia LDL goal <70 - Plan: Lipid panel  Personal history of colon cancer, stage II - Plan: CEA  Benign prostatic hyperplasia, unspecified whether lower urinary tract symptoms present - Plan: PSA  Encounter for long-term  current use of medication - Plan: Basic metabolic panel, CBC with Differential/Platelet, Hepatic function panel  Health Maintenance Exam: The patient's preventative maintenance and recommended screening tests for an annual wellness exam were reviewed in full today. Brought up to date unless services declined.  Counselled on the importance of diet, exercise, and its role in overall health and mortality. The patient's FH and SH was reviewed, including their home life, tobacco status, and drug and alcohol status.  Follow-up in 1 year for physical exam or additional follow-up below.  I have personally reviewed the Medicare Annual Wellness questionnaire and have noted 1. The patient's medical and social history 2. Their use of alcohol, tobacco or illicit drugs 3. Their current medications and supplements 4. The patient's functional ability including ADL's, fall risks, home safety risks and hearing or visual             impairment. 5. Diet and physical activities 6. Evidence for depression or mood disorders 7. Reviewed Updated provider list, see scanned forms and [facility] Snapshot.   The patients  weight, height, BMI and visual acuity have been recorded in the chart I have made referrals, counseling and provided education to the patient based review of the above and I have provided the pt with a written personalized care plan for preventive services.  I have provided the patient with a copy of your personalized plan for preventive services. Instructed to take the time to review along with their updated medication list.  Follow-up: No Follow-up on file. Or follow-up in 1 year for complete physical examination  New Prescriptions   No medications on file   Modified Medications   Modified Medication Previous Medication   FLUOXETINE (PROZAC) 20 MG CAPSULE FLUoxetine (PROZAC) 20 MG capsule      Take 1 capsule (20 mg total) by mouth daily.    Take 1 capsule (20 mg total) by mouth daily.   LOSARTAN (COZAAR) 50 MG TABLET losartan (COZAAR) 50 MG tablet      Take 1 tablet (50 mg total) by mouth daily.    Take 1 tablet (50 mg total) by mouth daily.   Orders Placed This Encounter  Procedures  . Basic metabolic panel  . CBC with Differential/Platelet  . Hepatic function panel  . Lipid panel  . PSA  . CEA    Signed,  Frederico Hamman T. Akshat Minehart, MD   Patient's Medications  New Prescriptions   No medications on file  Previous Medications   MULTIPLE VITAMINS-MINERALS (MULTIVITAMIN GUMMIES ADULTS PO)    Take by mouth daily.  Modified Medications   Modified Medication Previous Medication   FLUOXETINE (PROZAC) 20 MG CAPSULE FLUoxetine (PROZAC) 20 MG capsule      Take 1 capsule (20 mg total) by mouth daily.    Take 1 capsule (20 mg total) by mouth daily.   LOSARTAN (COZAAR) 50 MG TABLET losartan (COZAAR) 50 MG tablet      Take 1 tablet (50 mg total) by mouth daily.    Take 1 tablet (50 mg total) by mouth daily.  Discontinued Medications   No medications on file

## 2016-08-21 ENCOUNTER — Telehealth: Payer: Self-pay | Admitting: *Deleted

## 2016-08-21 LAB — CEA: CEA: 1 ng/mL

## 2016-08-21 NOTE — Telephone Encounter (Addendum)
Randy Boyle in inquiring why Randy Boyle was not given a  mental status exam at his [employer] Visit.  She states he has been having some issues and really needed this to be done at his visit.  Advised I would send a note to Dr. Lorelei Pont for review.

## 2016-08-23 NOTE — Telephone Encounter (Signed)
Attempted call on 2 different numbers but no answer. LMOM. Will attempt again tomorrow.

## 2016-08-24 NOTE — Addendum Note (Signed)
Addended by: Ellamae Sia on: 08/24/2016 03:16 PM   Modules accepted: Orders

## 2016-08-24 NOTE — Telephone Encounter (Signed)
Discussed with patient on the phone 15 minute conversation along with all labs.

## 2016-08-26 ENCOUNTER — Telehealth: Payer: Self-pay | Admitting: Radiology

## 2016-08-26 NOTE — Telephone Encounter (Signed)
Should he make an appt on the same day? Per your request? Or wait until labs are back? Please let the pts wife know.

## 2016-08-26 NOTE — Telephone Encounter (Signed)
Patients wife is concerned about pts memory. She wanted an appt to address this but doesn't want to tell her husband that is what he is coming in for. I set his lab appt up for 1.3.18 for his 1 month PSA f/u.

## 2016-08-27 NOTE — Telephone Encounter (Signed)
He needs to definitely get his PSA drawn - it was almost 6. I talked with him length about his memory a couple of days ago.   If his wife has ongoing concerns, I would schedule a follow-up at least a few days after his total and free PSA to discuss - 30 minute appointment.   I am going to send to [place].

## 2016-08-27 NOTE — Telephone Encounter (Signed)
Appointment scheduled for 09/30/2016 at 8:00 am with Dr. Lorelei Pont.

## 2016-09-23 ENCOUNTER — Other Ambulatory Visit (INDEPENDENT_AMBULATORY_CARE_PROVIDER_SITE_OTHER): Payer: Medicare Other

## 2016-09-23 DIAGNOSIS — R972 Elevated prostate specific antigen [PSA]: Secondary | ICD-10-CM | POA: Diagnosis not present

## 2016-09-23 NOTE — Addendum Note (Signed)
Addended by: Ellamae Sia on: 09/23/2016 10:42 AM   Modules accepted: Orders

## 2016-09-24 LAB — PSA, TOTAL AND FREE
PSA, % Free: 23 % — ABNORMAL LOW (ref >25)
PSA, FREE: 0.9 ng/mL
PSA, Total: 4 ng/mL (ref <=4.0)

## 2016-09-25 ENCOUNTER — Telehealth: Payer: Self-pay | Admitting: Family Medicine

## 2016-09-25 NOTE — Telephone Encounter (Signed)
Pt returned your call - please call [phone] Thank you

## 2016-09-25 NOTE — Telephone Encounter (Signed)
Mr. Raimer notified by telephone that his PSA came back down and looks normal again per Dr. Lorelei Pont.

## 2016-09-30 ENCOUNTER — Encounter: Payer: Self-pay | Admitting: Family Medicine

## 2016-09-30 ENCOUNTER — Ambulatory Visit (INDEPENDENT_AMBULATORY_CARE_PROVIDER_SITE_OTHER): Payer: Medicare Other | Admitting: Family Medicine

## 2016-09-30 VITALS — BP 120/74 | HR 85 | Temp 97.7°F | Ht 68.5 in | Wt 199.0 lb

## 2016-09-30 DIAGNOSIS — R972 Elevated prostate specific antigen [PSA]: Secondary | ICD-10-CM | POA: Diagnosis not present

## 2016-09-30 NOTE — Progress Notes (Signed)
Dr. Frederico Hamman T. Jyll Tomaro, MD, [facility] [place], [address] Phone: [phone] Fax: [phone]  09/30/2016  Patient: Randy Boyle, MRN: [id number], DOB: Jun 27, 1951, 66 y.o.  Primary Physician:  Owens Loffler, MD   Chief Complaint  Patient presents with  . Follow-up    Labs/Memory   Subjective:   Jasiel Swann is a 66 y.o. very pleasant male patient who presents with the following:  F/u memory: patient not really concerned - wife had some concern and called in again to our office after his last OV. 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 He thinks he is doing well - sometimes does not hear his wife through the door / wall and thinks that is what she is talking about.   PSA has returned to normal.  Results for orders placed or performed in visit on 09/23/16  PSA, total and free  Result Value Ref Range   PSA, Total 4.0 <=4.0 ng/mL   PSA, Free 0.9 Not Estab ng/mL   PSA, % Free 23 (L) >25 %     Past Medical History, Surgical History, Social History, Family History, Problem List, Medications, and Allergies have been reviewed and updated if relevant.  Patient Active Problem List   Diagnosis Date Noted  . Irritability and anger 04/18/2014  . Personal history of colon cancer, stage II   . OTHER MALAISE AND FATIGUE 03/18/2009  . Hyperlipidemia LDL goal <70 01/01/2009  . Essential hypertension 01/01/2009  . DIVERTICULITIS, HX OF 01/01/2009    Past Medical History:  Diagnosis Date  . Diverticulitis   . HLD (hyperlipidemia)   . HTN (hypertension)   . Hx of pancreatitis   . Personal history of colon cancer, stage II    Adenocarcinoma    Past Surgical History:  Procedure Laterality Date  . COLONOSCOPY    . HEMICOLECTOMY  07/2006    Social History   Social History  . Marital status: Married    Spouse name: N/A  . Number of children: 2  . Years of  education: N/A   Occupational History  . Retired    Social History Main Topics  . Smoking status: Never Smoker  . Smokeless tobacco: Never Used  . Alcohol use Yes     Comment: beer occassionally  . Drug use: No  . Sexual activity: Not on file   Other Topics Concern  . Not on file   Social History Narrative   [employer]   Married    Family History  Problem Relation Age of Onset  . Colon cancer Neg Hx   . Esophageal cancer Neg Hx   . Rectal cancer Neg Hx   . Stomach cancer Neg Hx     Allergies  Allergen Reactions  . Oxycodone-Acetaminophen     REACTION: hallucinations  . Penicillins     REACTION: reacted as a child    Medication list reviewed and updated in full in [place].   GEN: No acute illnesses, no fevers, chills. GI: No n/v/d, eating normally Pulm: No SOB Interactive and getting along well at home.  Otherwise, ROS is as per the HPI.  Objective:   BP 120/74   Pulse 85   Temp 97.7 F (36.5 C) (Oral)   Ht 5' 8.5" (1.74 m)   Wt 199 lb (90.3 kg)   BMI 29.82 kg/m    GEN: WDWN, NAD, Non-toxic, A &  O x 3 HEENT: Atraumatic, Normocephalic. Neck supple. No masses, No LAD. Ears and Nose: No external deformity. CV: RRR, No M/G/R. No JVD. No thrill. No extra heart sounds. PULM: CTA B, no wheezes, crackles, rhonchi. No retractions. No resp. distress. No accessory muscle use. ABD: S, NT, ND, +BS. No rebound tenderness. No HSM.  EXTR: No c/c/e  Neuro: CN 2-12 grossly intact. PERRLA. EOMI. Sensation intact throughout. Str 5/5 all extremities. DTR 2+. No clonus. A and o x 4. Romberg neg. Finger nose neg. Heel -shin neg.   Lillia Corporal is 29/30. Missed "apple" on 3 word recall.  PSYCH: Normally interactive. Conversant. Not depressed or anxious appearing.  Calm demeanor.     Laboratory and Imaging Data: Results for orders placed or performed in visit on 09/23/16  PSA, total and free  Result Value Ref Range   PSA, Total 4.0 <=4.0 ng/mL   PSA,  Free 0.9 Not Estab ng/mL   PSA, % Free 23 (L) >25 %     Assessment and Plan:   Elevated PSA  PSA normal. Everything regarding exam, history seems very reassuring to me without cause for concern or worry for dementia.  Follow-up: No Follow-up on file.  Signed,  Maud Deed. Athanasios Heldman, MD   Allergies as of 09/30/2016      Reactions   Oxycodone-acetaminophen    REACTION: hallucinations   Penicillins    REACTION: reacted as a child      Medication List       Accurate as of 09/30/16  9:08 AM. Always use your most recent med list.          FLUoxetine 20 MG capsule Commonly known as:  PROZAC Take 1 capsule (20 mg total) by mouth daily.   losartan 50 MG tablet Commonly known as:  COZAAR Take 1 tablet (50 mg total) by mouth daily.   MULTIVITAMIN GUMMIES ADULTS PO Take by mouth daily.

## 2016-09-30 NOTE — Progress Notes (Signed)
Pre visit review using our clinic review tool, if applicable. No additional management support is needed unless otherwise documented below in the visit note. 

## 2017-08-23 ENCOUNTER — Other Ambulatory Visit: Payer: Self-pay | Admitting: Family Medicine

## 2017-10-08 ENCOUNTER — Other Ambulatory Visit: Payer: Self-pay | Admitting: Family Medicine

## 2017-10-29 ENCOUNTER — Telehealth: Payer: Self-pay

## 2017-10-29 DIAGNOSIS — R972 Elevated prostate specific antigen [PSA]: Secondary | ICD-10-CM

## 2017-10-29 DIAGNOSIS — I10 Essential (primary) hypertension: Secondary | ICD-10-CM

## 2017-10-29 DIAGNOSIS — Z85038 Personal history of other malignant neoplasm of large intestine: Secondary | ICD-10-CM

## 2017-10-29 DIAGNOSIS — Z1159 Encounter for screening for other viral diseases: Secondary | ICD-10-CM

## 2017-10-29 DIAGNOSIS — E785 Hyperlipidemia, unspecified: Secondary | ICD-10-CM

## 2017-10-29 NOTE — Telephone Encounter (Signed)
Ordering CPE labs per PCP instruction:  [place], Frederico Hamman, MD  Candis Musa R, LPN        FLP, hyperlipidemia  Cbc with diff, HFP, BMET: long-term medication use  PSA: elevated psa  CEA: history of colon cancer   Hepatitis C: screen for hep c

## 2017-11-01 ENCOUNTER — Ambulatory Visit: Payer: Medicare Other

## 2017-11-04 ENCOUNTER — Ambulatory Visit: Payer: Medicare Other | Admitting: Family Medicine

## 2017-11-10 ENCOUNTER — Ambulatory Visit (INDEPENDENT_AMBULATORY_CARE_PROVIDER_SITE_OTHER): Payer: PPO

## 2017-11-10 VITALS — BP 118/70 | HR 88 | Temp 97.7°F | Ht 68.5 in | Wt 204.2 lb

## 2017-11-10 DIAGNOSIS — Z1159 Encounter for screening for other viral diseases: Secondary | ICD-10-CM | POA: Diagnosis not present

## 2017-11-10 DIAGNOSIS — Z85038 Personal history of other malignant neoplasm of large intestine: Secondary | ICD-10-CM

## 2017-11-10 DIAGNOSIS — Z0000 Encounter for general adult medical examination without abnormal findings: Secondary | ICD-10-CM | POA: Diagnosis not present

## 2017-11-10 DIAGNOSIS — R972 Elevated prostate specific antigen [PSA]: Secondary | ICD-10-CM

## 2017-11-10 DIAGNOSIS — E785 Hyperlipidemia, unspecified: Secondary | ICD-10-CM | POA: Diagnosis not present

## 2017-11-10 DIAGNOSIS — I10 Essential (primary) hypertension: Secondary | ICD-10-CM

## 2017-11-10 LAB — CBC WITH DIFFERENTIAL/PLATELET
Basophils Absolute: 0.1 10*3/uL (ref 0.0–0.1)
Basophils Relative: 1.1 % (ref 0.0–3.0)
EOS ABS: 0.1 10*3/uL (ref 0.0–0.7)
Eosinophils Relative: 1.7 % (ref 0.0–5.0)
HCT: 45.8 % (ref 39.0–52.0)
HEMOGLOBIN: 15.6 g/dL (ref 13.0–17.0)
LYMPHS ABS: 1.6 10*3/uL (ref 0.7–4.0)
Lymphocytes Relative: 18.8 % (ref 12.0–46.0)
MCHC: 34.1 g/dL (ref 30.0–36.0)
MCV: 85 fl (ref 78.0–100.0)
Monocytes Absolute: 0.6 10*3/uL (ref 0.1–1.0)
Monocytes Relative: 7.6 % (ref 3.0–12.0)
NEUTROS PCT: 70.8 % (ref 43.0–77.0)
Neutro Abs: 5.9 10*3/uL (ref 1.4–7.7)
Platelets: 336 10*3/uL (ref 150.0–400.0)
RBC: 5.38 Mil/uL (ref 4.22–5.81)
RDW: 13.6 % (ref 11.5–15.5)
WBC: 8.3 10*3/uL (ref 4.0–10.5)

## 2017-11-10 LAB — BASIC METABOLIC PANEL
BUN: 13 mg/dL (ref 6–23)
CHLORIDE: 99 meq/L (ref 96–112)
CO2: 30 meq/L (ref 19–32)
Calcium: 10.1 mg/dL (ref 8.4–10.5)
Creatinine, Ser: 1.03 mg/dL (ref 0.40–1.50)
GFR: 76.2 mL/min (ref >60.00)
GLUCOSE: 106 mg/dL — AB (ref 70–99)
POTASSIUM: 4.3 meq/L (ref 3.5–5.1)
Sodium: 138 mEq/L (ref 135–145)

## 2017-11-10 LAB — LIPID PANEL
CHOL/HDL RATIO: 7
CHOLESTEROL: 262 mg/dL — AB (ref 0–200)
HDL: 39.4 mg/dL (ref >39.00)

## 2017-11-10 LAB — HEPATIC FUNCTION PANEL
ALBUMIN: 4.6 g/dL (ref 3.5–5.2)
ALK PHOS: 77 U/L (ref 39–117)
ALT: 34 U/L (ref 0–53)
AST: 30 U/L (ref 0–37)
Bilirubin, Direct: 0.1 mg/dL (ref 0.0–0.3)
TOTAL PROTEIN: 7.6 g/dL (ref 6.0–8.3)
Total Bilirubin: 0.5 mg/dL (ref 0.2–1.2)

## 2017-11-10 LAB — LDL CHOLESTEROL, DIRECT: LDL DIRECT: 147 mg/dL

## 2017-11-10 LAB — PSA: PSA: 4.35 ng/mL — ABNORMAL HIGH (ref 0.10–4.00)

## 2017-11-10 NOTE — Progress Notes (Signed)
PCP notes:   Health maintenance:  Hep C screening - completed Flu vaccine - pt declined PNA vaccine - pt declined  Abnormal screenings:   Hearing - failed  Hearing Screening   125Hz  250Hz  500Hz  1000Hz  2000Hz  3000Hz  4000Hz  6000Hz  8000Hz   Right ear:   40 40 40  0    Left ear:   40 40 40  0     Patient concerns:   None  Nurse concerns:  None  Next PCP appt:   11/22/17 @ 1020

## 2017-11-10 NOTE — Progress Notes (Signed)
Subjective:   Randy Boyle is a 67 y.o. male who presents for Medicare Annual/Subsequent preventive examination.  Review of Systems:  N/A Cardiac Risk Factors include: advanced age (>80men, >35 women);male gender;obesity (BMI >30kg/m2);dyslipidemia;hypertension     Objective:    Vitals: BP 118/70 (BP Location: Left Arm, Patient Position: Sitting, Cuff Size: Normal)   Pulse 88   Temp 97.7 F (36.5 C) (Oral)   Ht 5' 8.5" (1.74 m) Comment: no shoes  Wt 204 lb 4 oz (92.6 kg)   SpO2 98%   BMI 30.60 kg/m   Body mass index is 30.6 kg/m.  Advanced Directives 11/10/2017 11/08/2015 10/25/2015  Does Patient Have a Medical Advance Directive? Yes Yes Yes  Type of Paramedic of [place];Living will Healthcare Power of [place];Living will  Does patient want to make changes to medical advance directive? - - No - Patient declined  Copy of [place] in Chart? No - copy requested - No - copy requested    Tobacco Social History   Tobacco Use  Smoking Status Never Smoker  Smokeless Tobacco Never Used     Counseling given: No   Clinical Intake:  Pre-visit preparation completed: Yes  Pain : No/denies pain Pain Score: 0-No pain     Nutritional Status: BMI > 30  Obese Nutritional Risks: Other (Comment) Diabetes: No  How often do you need to have someone help you when you read instructions, pamphlets, or other written materials from your doctor or pharmacy?: 1 - Never What is the last grade level you completed in school?: GED  Interpreter Needed?: No  Comments: pt lives with spouse Information entered by :: LPinson, LPN  Past Medical History:  Diagnosis Date  . Diverticulitis   . HLD (hyperlipidemia)   . HTN (hypertension)   . Hx of pancreatitis   . Personal history of colon cancer, stage II    Adenocarcinoma   Past Surgical History:  Procedure Laterality Date  . COLONOSCOPY    . HEMICOLECTOMY   07/2006   Family History  Problem Relation Age of Onset  . Colon cancer Neg Hx   . Esophageal cancer Neg Hx   . Rectal cancer Neg Hx   . Stomach cancer Neg Hx    Social History   Socioeconomic History  . Marital status: Married    Spouse name: None  . Number of children: 2  . Years of education: None  . Highest education level: None  Social Needs  . Financial resource strain: None  . Food insecurity - worry: None  . Food insecurity - inability: None  . Transportation needs - medical: None  . Transportation needs - non-medical: None  Occupational History  . Occupation: Retired  Tobacco Use  . Smoking status: Never Smoker  . Smokeless tobacco: Never Used  Substance and Sexual Activity  . Alcohol use: Yes    Comment: beer occassionally  . Drug use: No  . Sexual activity: None  Other Topics Concern  . None  Social History Narrative   Jehovah's Witness   Married    Outpatient Encounter Medications as of 11/10/2017  Medication Sig  . FLUoxetine (PROZAC) 20 MG capsule TAKE ONE CAPSULE BY MOUTH ONCE DAILY  . losartan (COZAAR) 50 MG tablet TAKE ONE TABLET BY MOUTH ONCE DAILY  . Multiple Vitamins-Minerals (MULTIVITAMIN GUMMIES ADULTS PO) Take by mouth daily.   No facility-administered encounter medications on file as of 11/10/2017.     Activities of  Daily Living In your present state of health, do you have any difficulty performing the following activities: 11/10/2017  Hearing? N  Vision? N  Difficulty concentrating or making decisions? N  Walking or climbing stairs? N  Dressing or bathing? N  Doing errands, shopping? N  Preparing Food and eating ? N  Using the Toilet? N  In the past six months, have you accidently leaked urine? N  Do you have problems with loss of bowel control? N  Managing your Medications? N  Managing your Finances? N  Housekeeping or managing your Housekeeping? N  Some recent data might be hidden    Patient Care Team: Owens Loffler, MD as  PCP - General   Assessment:   This is a routine wellness examination for [name].   Hearing Screening   125Hz  250Hz  500Hz  1000Hz  2000Hz  3000Hz  4000Hz  6000Hz  8000Hz   Right ear:   40 40 40  0    Left ear:   40 40 40  0      Visual Acuity Screening   Right eye Left eye Both eyes  Without correction:     With correction: 20/20 20/20 20/20    Exercise Activities and Dietary recommendations Current Exercise Habits: The patient does not participate in regular exercise at present, Exercise limited by: None identified  Goals    . Increase physical activity     When weather permits, I will resume walking and doing yard work.        Fall Risk Fall Risk  11/10/2017 08/19/2016 04/18/2014  Falls in the past year? No No No    Depression Screen PHQ 2/9 Scores 11/10/2017 08/19/2016 04/18/2014  PHQ - 2 Score 0 0 0  PHQ- 9 Score 0 - -    Cognitive Function MMSE - Mini Mental State Exam 11/10/2017  Orientation to time 5  Orientation to Place 5  Registration 3  Attention/ Calculation 0  Recall 3  Language- name 2 objects 0  Language- repeat 1  Language- follow 3 step command 3  Language- read & follow direction 0  Write a sentence 0  Copy design 0  Total score 20     PLEASE NOTE: A Mini-Cog screen was completed. Maximum score is 20. A value of 0 denotes this part of Folstein MMSE was not completed or the patient failed this part of the Mini-Cog screening.   Mini-Cog Screening Orientation to Time - Max 5 pts Orientation to Place - Max 5 pts Registration - Max 3 pts Recall - Max 3 pts Language Repeat - Max 1 pts Language Follow 3 Step Command - Max 3 pts     Immunization History  Administered Date(s) Administered  . Td 03/09/2008   Screening Tests Health Maintenance  Topic Date Due  . INFLUENZA VACCINE  12/20/2018 (Originally 04/21/2017)  . PNA vac Low Risk Adult (1 of 2 - PCV13) 12/20/2018 (Originally 04/19/2014)  . TETANUS/TDAP  03/09/2018  . COLONOSCOPY  11/07/2020  .  Hepatitis C Screening  Completed      Plan:     I have personally reviewed, addressed, and noted the following in the patient's chart:  A. Medical and social history B. Use of alcohol, tobacco or illicit drugs  C. Current medications and supplements D. Functional ability and status E.  Nutritional status F.  Physical activity G. Advance directives H. List of other physicians I.  Hospitalizations, surgeries, and ER visits in previous 12 months J.  [place] to include hearing, vision, cognitive, depression L. Referrals  and appointments - none  In addition, I have reviewed and discussed with patient certain preventive protocols, quality metrics, and best practice recommendations. A written personalized care plan for preventive services as well as general preventive health recommendations were provided to patient.  See attached scanned questionnaire for additional information.   Signed,   Lindell Noe, MHA, BS, LPN Health Coach

## 2017-11-10 NOTE — Patient Instructions (Signed)
Randy Boyle , Thank you for taking time to come for your Medicare Wellness Visit. I appreciate your ongoing commitment to your health goals. Please review the following plan we discussed and let me know if I can assist you in the future.   These are the goals we discussed: Goals    . Increase physical activity     When weather permits, I will resume walking and doing yard work.        This is a list of the screening recommended for you and due dates:  Health Maintenance  Topic Date Due  . Flu Shot  12/20/2018*  . Pneumonia vaccines (1 of 2 - PCV13) 12/20/2018*  . Tetanus Vaccine  03/09/2018  . Colon Cancer Screening  11/07/2020  .  Hepatitis C: One time screening is recommended by Center for Disease Control  (CDC) for  adults born from [birth year] through 1967.   Completed  *Topic was postponed. The date shown is not the original due date.   Preventive Care for Adults  A healthy lifestyle and preventive care can promote health and wellness. Preventive health guidelines for adults include the following key practices.  . A routine yearly physical is a good way to check with your health care provider about your health and preventive screening. It is a chance to share any concerns and updates on your health and to receive a thorough exam.  . Visit your dentist for a routine exam and preventive care every 6 months. Brush your teeth twice a day and floss once a day. Good oral hygiene prevents tooth decay and gum disease.  . The frequency of eye exams is based on your age, health, family medical history, use  of contact lenses, and other factors. Follow your health care provider's recommendations for frequency of eye exams.  . Eat a healthy diet. Foods like vegetables, fruits, whole grains, low-fat dairy products, and lean protein foods contain the nutrients you need without too many calories. Decrease your intake of foods high in solid fats, added sugars, and salt. Eat the right amount of calories  for you. Get information about a proper diet from your health care provider, if necessary.  . Regular physical exercise is one of the most important things you can do for your health. Most adults should get at least 150 minutes of moderate-intensity exercise (any activity that increases your heart rate and causes you to sweat) each week. In addition, most adults need muscle-strengthening exercises on 2 or more days a week.  Silver Sneakers may be a benefit available to you. To determine eligibility, you may visit the website: www.silversneakers.com or contact program at [phone] Mon-Fri between 8AM-8PM.   . Maintain a healthy weight. The body mass index (BMI) is a screening tool to identify possible weight problems. It provides an estimate of body fat based on height and weight. Your health care provider can find your BMI and can help you achieve or maintain a healthy weight.   For adults 20 years and older: ? A BMI below 18.5 is considered underweight. ? A BMI of 18.5 to 24.9 is normal. ? A BMI of 25 to 29.9 is considered overweight. ? A BMI of 30 and above is considered obese.   . Maintain normal blood lipids and cholesterol levels by exercising and minimizing your intake of saturated fat. Eat a balanced diet with plenty of fruit and vegetables. Blood tests for lipids and cholesterol should begin at age 61 and be repeated every 5 years.  If your lipid or cholesterol levels are high, you are over 50, or you are at high risk for heart disease, you may need your cholesterol levels checked more frequently. Ongoing high lipid and cholesterol levels should be treated with medicines if diet and exercise are not working.  . If you smoke, find out from your health care provider how to quit. If you do not use tobacco, please do not start.  . If you choose to drink alcohol, please do not consume more than 2 drinks per day. One drink is considered to be 12 ounces (355 mL) of beer, 5 ounces (148 mL) of  wine, or 1.5 ounces (44 mL) of liquor.  . If you are 12-62 years old, ask your health care provider if you should take aspirin to prevent strokes.  . Use sunscreen. Apply sunscreen liberally and repeatedly throughout the day. You should seek shade when your shadow is shorter than you. Protect yourself by wearing long sleeves, pants, a wide-brimmed hat, and sunglasses year round, whenever you are outdoors.  . Once a month, do a whole body skin exam, using a mirror to look at the skin on your back. Tell your health care provider of new moles, moles that have irregular borders, moles that are larger than a pencil eraser, or moles that have changed in shape or color.

## 2017-11-10 NOTE — Progress Notes (Signed)
I reviewed health advisor's note, was available for consultation, and agree with documentation and plan.   Signed,  Johnnay Pleitez T. Joselin Crandell, MD  

## 2017-11-11 LAB — HEPATITIS C ANTIBODY
HEP C AB: NONREACTIVE
SIGNAL TO CUT-OFF: 0.01 (ref <1.00)

## 2017-11-11 LAB — CEA: CEA: 1.9 ng/mL

## 2017-11-22 ENCOUNTER — Ambulatory Visit (INDEPENDENT_AMBULATORY_CARE_PROVIDER_SITE_OTHER): Payer: PPO | Admitting: Family Medicine

## 2017-11-22 ENCOUNTER — Encounter: Payer: Self-pay | Admitting: Family Medicine

## 2017-11-22 ENCOUNTER — Other Ambulatory Visit: Payer: Self-pay

## 2017-11-22 VITALS — BP 100/70 | HR 83 | Temp 98.4°F | Ht 68.5 in | Wt 208.2 lb

## 2017-11-22 DIAGNOSIS — Z0000 Encounter for general adult medical examination without abnormal findings: Secondary | ICD-10-CM

## 2017-11-22 DIAGNOSIS — E785 Hyperlipidemia, unspecified: Secondary | ICD-10-CM | POA: Diagnosis not present

## 2017-11-22 DIAGNOSIS — Z79899 Other long term (current) drug therapy: Secondary | ICD-10-CM

## 2017-11-22 DIAGNOSIS — R972 Elevated prostate specific antigen [PSA]: Secondary | ICD-10-CM

## 2017-11-22 MED ORDER — FENOFIBRATE 160 MG PO TABS: 160.0000 mg | ORAL_TABLET | Freq: Every day | ORAL | 3 refills | Status: DC

## 2017-11-22 NOTE — Progress Notes (Signed)
Dr. Frederico Hamman T. Alima Naser, MD, [facility] [place], [address] Phone: [phone] Fax: [phone]  11/22/2017  Patient: Randy Boyle, MRN: [id number], DOB: 04/20/1951, 67 y.o.  Primary Physician:  Owens Loffler, MD   Chief Complaint  Patient presents with  . Annual Exam    Part 2   Subjective:   Randy Boyle is a 67 y.o. pleasant patient who presents with the following:  Preventative Health Maintenance Visit:  Health Maintenance Summary Reviewed and updated, unless pt declines services.  Tobacco History Reviewed. Alcohol: No concerns, no excessive use Exercise Habits: Some activity, rec at least 30 mins 5 times a week STD concerns: no risk or activity to increase risk Drug Use: None Encouraged self-testicular check  Health Maintenance  Topic Date Due  . INFLUENZA VACCINE  12/20/2018 (Originally 04/21/2017)  . PNA vac Low Risk Adult (1 of 2 - PCV13) 12/20/2018 (Originally 04/19/2014)  . TETANUS/TDAP  03/09/2018  . COLONOSCOPY  11/07/2020  . Hepatitis C Screening  Completed   Immunization History  Administered Date(s) Administered  . Td 03/09/2008   Patient Active Problem List   Diagnosis Date Noted  . Irritability and anger 04/18/2014  . Personal history of colon cancer, stage II   . OTHER MALAISE AND FATIGUE 03/18/2009  . Hyperlipidemia LDL goal <70 01/01/2009  . Essential hypertension 01/01/2009  . DIVERTICULITIS, HX OF 01/01/2009   Past Medical History:  Diagnosis Date  . Diverticulitis   . HLD (hyperlipidemia)   . HTN (hypertension)   . Hx of pancreatitis   . Personal history of colon cancer, stage II    Adenocarcinoma   Past Surgical History:  Procedure Laterality Date  . COLONOSCOPY    . HEMICOLECTOMY  07/2006   Social History   Socioeconomic History  . Marital status: Married    Spouse name: Not on file  . Number of children: 2  . Years of education: Not on file  . Highest  education level: Not on file  Social Needs  . Financial resource strain: Not on file  . Food insecurity - worry: Not on file  . Food insecurity - inability: Not on file  . Transportation needs - medical: Not on file  . Transportation needs - non-medical: Not on file  Occupational History  . Occupation: Retired  Tobacco Use  . Smoking status: Never Smoker  . Smokeless tobacco: Never Used  Substance and Sexual Activity  . Alcohol use: Yes    Comment: beer occassionally  . Drug use: No  . Sexual activity: Not on file  Other Topics Concern  . Not on file  Social History Narrative   Jehovah's Witness   Married   Family History  Problem Relation Age of Onset  . Colon cancer Neg Hx   . Esophageal cancer Neg Hx   . Rectal cancer Neg Hx   . Stomach cancer Neg Hx    Allergies  Allergen Reactions  . Oxycodone-Acetaminophen     REACTION: hallucinations  . Penicillins     REACTION: reacted as a child    Medication list has been reviewed and updated.   General: Denies fever, chills, sweats. No significant weight loss. Eyes: Denies blurring,significant itching ENT: Denies earache, sore throat, and hoarseness. Cardiovascular: Denies chest pains, palpitations, dyspnea on exertion Respiratory: Denies cough, dyspnea at rest,wheeezing Breast: no concerns about lumps GI: Denies nausea, vomiting, diarrhea, constipation, change in bowel habits, abdominal pain, melena, hematochezia GU: Denies  penile discharge, ED, urinary flow / outflow problems. No STD concerns. Musculoskeletal: Denies back pain, joint pain Derm: Denies rash, itching Neuro: Denies  paresthesias, frequent falls, frequent headaches Psych: Denies depression, anxiety Endocrine: Denies cold intolerance, heat intolerance, polydipsia Heme: Denies enlarged lymph nodes Allergy: No hayfever  Objective:   BP 100/70   Pulse 83   Temp 98.4 F (36.9 C) (Oral)   Ht 5' 8.5" (1.74 m)   Wt 208 lb 4 oz (94.5 kg)   BMI 31.20  kg/m  Ideal Body Weight: Weight in (lb) to have BMI = 25: 166.5  No exam data present  GEN: well developed, well nourished, no acute distress Eyes: conjunctiva and lids normal, PERRLA, EOMI ENT: TM clear, nares clear, oral exam WNL Neck: supple, no lymphadenopathy, no thyromegaly, no JVD Pulm: clear to auscultation and percussion, respiratory effort normal CV: regular rate and rhythm, S1-S2, no murmur, rub or gallop, no bruits, peripheral pulses normal and symmetric, no cyanosis, clubbing, edema or varicosities GI: soft, non-tender; no hepatosplenomegaly, masses; active bowel sounds all quadrants GU: no hernia, testicular mass, penile discharge Lymph: no cervical, axillary or inguinal adenopathy MSK: gait normal, muscle tone and strength WNL, no joint swelling, effusions, discoloration, crepitus  SKIN: clear, good turgor, color WNL, no rashes, lesions, or ulcerations Neuro: normal mental status, normal strength, sensation, and motion Psych: alert; oriented to person, place and time, normally interactive and not anxious or depressed in appearance.  All labs reviewed with patient.  Lipids:    Component Value Date/Time   CHOL 262 (H) 11/10/2017 1403   TRIG (H) 11/10/2017 1403    470.0 Triglyceride is over 400; calculations on Lipids are invalid.   HDL 39.40 11/10/2017 1403   LDLDIRECT 147.0 11/10/2017 1403   VLDL 47.4 (H) 08/19/2016 0923   CHOLHDL 7 11/10/2017 1403   CBC: CBC Latest Ref Rng & Units 11/10/2017 08/19/2016 04/10/2014  WBC 4.0 - 10.5 K/uL 8.3 15.6(H) 5.8  Hemoglobin 13.0 - 17.0 g/dL 15.6 15.3 14.8  Hematocrit 39.0 - 52.0 % 45.8 44.8 44.2  Platelets 150.0 - 400.0 K/uL 336.0 Result may be falsely decreased due to platelet clumping. 245.0 517.6    Basic Metabolic Panel:    Component Value Date/Time   NA 138 11/10/2017 1403   K 4.3 11/10/2017 1403   CL 99 11/10/2017 1403   CO2 30 11/10/2017 1403   BUN 13 11/10/2017 1403   CREATININE 1.03 11/10/2017 1403   GLUCOSE  106 (H) 11/10/2017 1403   CALCIUM 10.1 11/10/2017 1403   Hepatic Function Latest Ref Rng & Units 11/10/2017 08/19/2016 04/10/2014  Total Protein 6.0 - 8.3 g/dL 7.6 7.5 7.1  Albumin 3.5 - 5.2 g/dL 4.6 4.6 4.3  AST 0 - 37 U/L '30 21 23  ' ALT 0 - 53 U/L 34 23 25  Alk Phosphatase 39 - 117 U/L 77 75 60  Total Bilirubin 0.2 - 1.2 mg/dL 0.5 0.6 0.5  Bilirubin, Direct 0.0 - 0.3 mg/dL 0.1 0.1 0.1    Lab Results  Component Value Date   TSH 1.86 12/29/2010   Lab Results  Component Value Date   PSA 4.35 (H) 11/10/2017   PSA 5.70 (H) 08/19/2016   PSA 4.57 (H) 04/10/2014    Assessment and Plan:   Healthcare maintenance  Elevated PSA - Plan: PSA, total and free  Hyperlipidemia LDL goal <100 - Plan: Lipid panel, CANCELED: Lipid panel, CANCELED: Hepatic function panel  Encounter for long-term (current) use of medications - Plan: Hepatic function panel, CANCELED:  Hepatic function panel   Elevated PSA, recurrent.  Patient did have a prior evaluation by urology in 2015, and I reviewed their recommendations with the patient.  Offered him another evaluation by urology.  At this point he declines that recommendation and would like to pursue another recheck of his PSA in 2 months, which I think is not unreasonable.  Intolerance to statins.  Elevated triglycerides and lipids.  Start fenofibrate, recheck lipid and hepatic panel in 2 months.  I also noticed that he had 20 out of 30 on a Folstein on his Medicare wellness exam.  Patient verbally denies any memory problems at all to me.  I repeated a complete full folstein mental status exam myself, and he obtained a 26 out of 30.   Health Maintenance Exam: The patient's preventative maintenance and recommended screening tests for an annual wellness exam were reviewed in full today. Brought up to date unless services declined.  Counselled on the importance of diet, exercise, and its role in overall health and mortality. The patient's FH and SH was  reviewed, including their home life, tobacco status, and drug and alcohol status.  Follow-up in 1 year for physical exam or additional follow-up below.  Follow-up: Return in about 2 months (around 01/22/2018) for blood draw only. Or follow-up in 1 year if not noted.  Meds ordered this encounter  Medications  . fenofibrate 160 MG tablet    Sig: Take 1 tablet (160 mg total) by mouth daily.    Dispense:  90 tablet    Refill:  3   There are no discontinued medications. Orders Placed This Encounter  Procedures  . PSA, total and free    Signed,  Furqan Gosselin T. Wille Aubuchon, MD   Allergies as of 11/22/2017      Reactions   Oxycodone-acetaminophen    REACTION: hallucinations   Penicillins    REACTION: reacted as a child      Medication List        Accurate as of 11/22/17 11:59 PM. Always use your most recent med list.          fenofibrate 160 MG tablet Take 1 tablet (160 mg total) by mouth daily.   FLUoxetine 20 MG capsule Commonly known as:  PROZAC TAKE ONE CAPSULE BY MOUTH ONCE DAILY   losartan 50 MG tablet Commonly known as:  COZAAR TAKE ONE TABLET BY MOUTH ONCE DAILY   MULTIVITAMIN GUMMIES ADULTS PO Take by mouth daily.

## 2017-11-29 ENCOUNTER — Other Ambulatory Visit: Payer: Self-pay | Admitting: Family Medicine

## 2018-01-07 ENCOUNTER — Other Ambulatory Visit: Payer: Self-pay | Admitting: Family Medicine

## 2018-01-21 ENCOUNTER — Other Ambulatory Visit (INDEPENDENT_AMBULATORY_CARE_PROVIDER_SITE_OTHER): Payer: PPO

## 2018-01-21 DIAGNOSIS — E785 Hyperlipidemia, unspecified: Secondary | ICD-10-CM | POA: Diagnosis not present

## 2018-01-21 DIAGNOSIS — R972 Elevated prostate specific antigen [PSA]: Secondary | ICD-10-CM

## 2018-01-21 DIAGNOSIS — Z79899 Other long term (current) drug therapy: Secondary | ICD-10-CM

## 2018-01-21 LAB — LIPID PANEL
CHOLESTEROL: 215 mg/dL — AB (ref 0–200)
HDL: 54.8 mg/dL (ref >39.00)
LDL CALC: 132 mg/dL — AB (ref 0–99)
NONHDL: 160.16
Total CHOL/HDL Ratio: 4
Triglycerides: 142 mg/dL (ref 0.0–149.0)
VLDL: 28.4 mg/dL (ref 0.0–40.0)

## 2018-01-21 LAB — HEPATIC FUNCTION PANEL
ALT: 53 U/L (ref 0–53)
AST: 39 U/L — AB (ref 0–37)
Albumin: 4.7 g/dL (ref 3.5–5.2)
Alkaline Phosphatase: 49 U/L (ref 39–117)
BILIRUBIN TOTAL: 0.4 mg/dL (ref 0.2–1.2)
Bilirubin, Direct: 0.1 mg/dL (ref 0.0–0.3)
Total Protein: 7.5 g/dL (ref 6.0–8.3)

## 2018-01-24 LAB — PSA, TOTAL AND FREE
PSA, % FREE: 23 % — AB (ref >25)
PSA, Free: 0.9 ng/mL
PSA, TOTAL: 4 ng/mL (ref <=4.0)

## 2018-01-26 ENCOUNTER — Encounter: Payer: Self-pay | Admitting: *Deleted

## 2018-07-08 ENCOUNTER — Other Ambulatory Visit: Payer: Self-pay | Admitting: Family Medicine

## 2018-11-08 ENCOUNTER — Other Ambulatory Visit: Payer: Self-pay | Admitting: Family Medicine

## 2018-11-15 ENCOUNTER — Other Ambulatory Visit: Payer: Self-pay | Admitting: Family Medicine

## 2018-11-15 DIAGNOSIS — Z79899 Other long term (current) drug therapy: Secondary | ICD-10-CM

## 2018-11-15 DIAGNOSIS — R972 Elevated prostate specific antigen [PSA]: Secondary | ICD-10-CM

## 2018-11-15 DIAGNOSIS — E785 Hyperlipidemia, unspecified: Secondary | ICD-10-CM

## 2018-11-15 DIAGNOSIS — I10 Essential (primary) hypertension: Secondary | ICD-10-CM

## 2018-11-15 DIAGNOSIS — Z85038 Personal history of other malignant neoplasm of large intestine: Secondary | ICD-10-CM

## 2018-11-15 DIAGNOSIS — Z131 Encounter for screening for diabetes mellitus: Secondary | ICD-10-CM

## 2018-11-17 ENCOUNTER — Ambulatory Visit: Payer: Medicare Other

## 2018-11-17 ENCOUNTER — Ambulatory Visit (INDEPENDENT_AMBULATORY_CARE_PROVIDER_SITE_OTHER): Payer: PPO

## 2018-11-17 VITALS — BP 138/90 | HR 82 | Temp 97.8°F | Ht 69.5 in | Wt 198.1 lb

## 2018-11-17 DIAGNOSIS — Z131 Encounter for screening for diabetes mellitus: Secondary | ICD-10-CM

## 2018-11-17 DIAGNOSIS — Z85038 Personal history of other malignant neoplasm of large intestine: Secondary | ICD-10-CM | POA: Diagnosis not present

## 2018-11-17 DIAGNOSIS — Z0000 Encounter for general adult medical examination without abnormal findings: Secondary | ICD-10-CM | POA: Diagnosis not present

## 2018-11-17 DIAGNOSIS — E785 Hyperlipidemia, unspecified: Secondary | ICD-10-CM

## 2018-11-17 DIAGNOSIS — Z79899 Other long term (current) drug therapy: Secondary | ICD-10-CM | POA: Diagnosis not present

## 2018-11-17 DIAGNOSIS — R972 Elevated prostate specific antigen [PSA]: Secondary | ICD-10-CM | POA: Diagnosis not present

## 2018-11-17 DIAGNOSIS — I10 Essential (primary) hypertension: Secondary | ICD-10-CM

## 2018-11-17 LAB — LIPID PANEL
Cholesterol: 195 mg/dL (ref 0–200)
HDL: 58 mg/dL (ref >39.00)
LDL Cholesterol: 113 mg/dL — ABNORMAL HIGH (ref 0–99)
NonHDL: 136.73
TRIGLYCERIDES: 119 mg/dL (ref 0.0–149.0)
Total CHOL/HDL Ratio: 3
VLDL: 23.8 mg/dL (ref 0.0–40.0)

## 2018-11-17 LAB — HEPATIC FUNCTION PANEL
ALBUMIN: 5 g/dL (ref 3.5–5.2)
ALT: 23 U/L (ref 0–53)
AST: 33 U/L (ref 0–37)
Alkaline Phosphatase: 49 U/L (ref 39–117)
Bilirubin, Direct: 0.1 mg/dL (ref 0.0–0.3)
Total Bilirubin: 0.4 mg/dL (ref 0.2–1.2)
Total Protein: 7.9 g/dL (ref 6.0–8.3)

## 2018-11-17 LAB — BASIC METABOLIC PANEL
BUN: 19 mg/dL (ref 6–23)
CHLORIDE: 101 meq/L (ref 96–112)
CO2: 30 meq/L (ref 19–32)
Calcium: 10.2 mg/dL (ref 8.4–10.5)
Creatinine, Ser: 1.29 mg/dL (ref 0.40–1.50)
GFR: 55.13 mL/min — ABNORMAL LOW (ref >60.00)
Glucose, Bld: 111 mg/dL — ABNORMAL HIGH (ref 70–99)
Potassium: 4.3 mEq/L (ref 3.5–5.1)
Sodium: 139 mEq/L (ref 135–145)

## 2018-11-17 LAB — HEMOGLOBIN A1C: Hgb A1c MFr Bld: 5.9 % (ref 4.6–6.5)

## 2018-11-17 LAB — CBC WITH DIFFERENTIAL/PLATELET
Basophils Absolute: 0 10*3/uL (ref 0.0–0.1)
Basophils Relative: 0.7 % (ref 0.0–3.0)
Eosinophils Absolute: 0.1 10*3/uL (ref 0.0–0.7)
Eosinophils Relative: 2.1 % (ref 0.0–5.0)
HCT: 44.7 % (ref 39.0–52.0)
Hemoglobin: 15.4 g/dL (ref 13.0–17.0)
LYMPHS ABS: 1.1 10*3/uL (ref 0.7–4.0)
Lymphocytes Relative: 17.1 % (ref 12.0–46.0)
MCHC: 34.5 g/dL (ref 30.0–36.0)
MCV: 84.4 fl (ref 78.0–100.0)
Monocytes Absolute: 0.5 10*3/uL (ref 0.1–1.0)
Monocytes Relative: 8.2 % (ref 3.0–12.0)
Neutro Abs: 4.8 10*3/uL (ref 1.4–7.7)
Neutrophils Relative %: 71.9 % (ref 43.0–77.0)
Platelets: 282 10*3/uL (ref 150.0–400.0)
RBC: 5.3 Mil/uL (ref 4.22–5.81)
RDW: 13.8 % (ref 11.5–15.5)
WBC: 6.7 10*3/uL (ref 4.0–10.5)

## 2018-11-17 NOTE — Progress Notes (Signed)
PCP notes:   Health maintenance:  No gaps identified.  Abnormal screenings:   Hearing - failed  Hearing Screening   125Hz  250Hz  500Hz  1000Hz  2000Hz  3000Hz  4000Hz  6000Hz  8000Hz   Right ear:   40 40 40  0    Left ear:   40 40 40  40      Visual Acuity Screening   Right eye Left eye Both eyes  Without correction:     With correction: 20/20 20/20 20/20     Patient concerns:   None  Nurse concerns:  None  Next PCP appt:   11/23/18 @ 0800

## 2018-11-17 NOTE — Patient Instructions (Signed)
Mr. Diffee , Thank you for taking time to come for your Medicare Wellness Visit. I appreciate your ongoing commitment to your health goals. Please review the following plan we discussed and let me know if I can assist you in the future.   These are the goals we discussed: Goals    . Increase physical activity     Starting 11/17/2018, I will continue to walk at least 30 minutes daily.        This is a list of the screening recommended for you and due dates:  Health Maintenance  Topic Date Due  . Flu Shot  12/20/2018*  . Pneumonia vaccines (1 of 2 - PCV13) 12/20/2018*  . Tetanus Vaccine  09/20/2020*  . Colon Cancer Screening  11/07/2020  .  Hepatitis C: One time screening is recommended by Center for Disease Control  (CDC) for  adults born from [birth year] through 1967.   Completed  *Topic was postponed. The date shown is not the original due date.   Preventive Care for Adults  A healthy lifestyle and preventive care can promote health and wellness. Preventive health guidelines for adults include the following key practices.  . A routine yearly physical is a good way to check with your health care provider about your health and preventive screening. It is a chance to share any concerns and updates on your health and to receive a thorough exam.  . Visit your dentist for a routine exam and preventive care every 6 months. Brush your teeth twice a day and floss once a day. Good oral hygiene prevents tooth decay and gum disease.  . The frequency of eye exams is based on your age, health, family medical history, use  of contact lenses, and other factors. Follow your health care provider's recommendations for frequency of eye exams.  . Eat a healthy diet. Foods like vegetables, fruits, whole grains, low-fat dairy products, and lean protein foods contain the nutrients you need without too many calories. Decrease your intake of foods high in solid fats, added sugars, and salt. Eat the right amount of  calories for you. Get information about a proper diet from your health care provider, if necessary.  . Regular physical exercise is one of the most important things you can do for your health. Most adults should get at least 150 minutes of moderate-intensity exercise (any activity that increases your heart rate and causes you to sweat) each week. In addition, most adults need muscle-strengthening exercises on 2 or more days a week.  Silver Sneakers may be a benefit available to you. To determine eligibility, you may visit the website: www.silversneakers.com or contact program at 772-796-5783 Mon-Fri between 8AM-8PM.   . Maintain a healthy weight. The body mass index (BMI) is a screening tool to identify possible weight problems. It provides an estimate of body fat based on height and weight. Your health care provider can find your BMI and can help you achieve or maintain a healthy weight.   For adults 20 years and older: ? A BMI below 18.5 is considered underweight. ? A BMI of 18.5 to 24.9 is normal. ? A BMI of 25 to 29.9 is considered overweight. ? A BMI of 30 and above is considered obese.   . Maintain normal blood lipids and cholesterol levels by exercising and minimizing your intake of saturated fat. Eat a balanced diet with plenty of fruit and vegetables. Blood tests for lipids and cholesterol should begin at age 17 and be repeated every 5  years. If your lipid or cholesterol levels are high, you are over 50, or you are at high risk for heart disease, you may need your cholesterol levels checked more frequently. Ongoing high lipid and cholesterol levels should be treated with medicines if diet and exercise are not working.  . If you smoke, find out from your health care provider how to quit. If you do not use tobacco, please do not start.  . If you choose to drink alcohol, please do not consume more than 2 drinks per day. One drink is considered to be 12 ounces (355 mL) of beer, 5 ounces  (148 mL) of wine, or 1.5 ounces (44 mL) of liquor.  . If you are 22-17 years old, ask your health care provider if you should take aspirin to prevent strokes.  . Use sunscreen. Apply sunscreen liberally and repeatedly throughout the day. You should seek shade when your shadow is shorter than you. Protect yourself by wearing long sleeves, pants, a wide-brimmed hat, and sunglasses year round, whenever you are outdoors.  . Once a month, do a whole body skin exam, using a mirror to look at the skin on your back. Tell your health care provider of new moles, moles that have irregular borders, moles that are larger than a pencil eraser, or moles that have changed in shape or color.

## 2018-11-17 NOTE — Progress Notes (Signed)
I reviewed health advisor's note, was available for consultation, and agree with documentation and plan.   Signed,  Kenyada Hy T. Stefany Starace, MD  

## 2018-11-17 NOTE — Progress Notes (Signed)
Subjective:   Randy Boyle is a 68 y.o. male who presents for Medicare Annual/Subsequent preventive examination.  Review of Systems:  N/A Cardiac Risk Factors include: advanced age (>63men, >10 women);diabetes mellitus;dyslipidemia;hypertension;male gender     Objective:    Vitals: BP 138/90 (BP Location: Left Arm, Patient Position: Sitting, Cuff Size: Normal)   Pulse 82   Temp 97.8 F (36.6 C) (Oral)   Ht 5' 9.5" (1.765 m) Comment: shoes  Wt 198 lb 1.6 oz (89.9 kg)   SpO2 95%   BMI 28.83 kg/m   Body mass index is 28.83 kg/m.  Advanced Directives 11/17/2018 11/10/2017 11/08/2015 10/25/2015  Does Patient Have a Medical Advance Directive? Yes Yes Yes Yes  Type of Paramedic of [place];Living will Healthcare Power of [place];Living will  Does patient want to make changes to medical advance directive? - - - No - Patient declined  Copy of [place] in Chart? (No Data) No - copy requested - No - copy requested    Tobacco Social History   Tobacco Use  Smoking Status Never Smoker  Smokeless Tobacco Never Used     Counseling given: No   Clinical Intake:  Pre-visit preparation completed: Yes  Pain : No/denies pain Pain Score: 0-No pain     Nutritional Status: BMI 25 -29 Overweight Nutritional Risks: None Diabetes: Yes CBG done?: No Did pt. bring in CBG monitor from home?: No  How often do you need to have someone help you when you read instructions, pamphlets, or other written materials from your doctor or pharmacy?: 1 - Never What is the last grade level you completed in school?: GED  Interpreter Needed?: No  Comments: pt lives with spouse Information entered by :: LPinson, LPN  Past Medical History:  Diagnosis Date  . Diverticulitis   . HLD (hyperlipidemia)   . HTN (hypertension)   . Hx of pancreatitis   . Personal history of colon cancer, stage II    Adenocarcinoma   Past Surgical History:  Procedure Laterality Date  . COLONOSCOPY    . HEMICOLECTOMY  07/2006   Family History  Problem Relation Age of Onset  . Colon cancer Neg Hx   . Esophageal cancer Neg Hx   . Rectal cancer Neg Hx   . Stomach cancer Neg Hx    Social History   Socioeconomic History  . Marital status: Married    Spouse name: Not on file  . Number of children: 2  . Years of education: Not on file  . Highest education level: Not on file  Occupational History  . Occupation: Retired  Scientific laboratory technician  . Financial resource strain: Not on file  . Food insecurity:    Worry: Not on file    Inability: Not on file  . Transportation needs:    Medical: Not on file    Non-medical: Not on file  Tobacco Use  . Smoking status: Never Smoker  . Smokeless tobacco: Never Used  Substance and Sexual Activity  . Alcohol use: Yes    Comment: beer occassionally  . Drug use: No  . Sexual activity: Not on file  Lifestyle  . Physical activity:    Days per week: Not on file    Minutes per session: Not on file  . Stress: Not on file  Relationships  . Social connections:    Talks on phone: Not on file    Gets together: Not on file  Attends religious service: Not on file    Active member of club or organization: Not on file    Attends meetings of clubs or organizations: Not on file    Relationship status: Not on file  Other Topics Concern  . Not on file  Social History Narrative   [employer] Witness   Married    Outpatient Encounter Medications as of 11/17/2018  Medication Sig  . fenofibrate 160 MG tablet TAKE 1 TABLET BY MOUTH ONCE DAILY  . FLUoxetine (PROZAC) 20 MG capsule TAKE 1 CAPSULE BY MOUTH ONCE DAILY  . losartan (COZAAR) 50 MG tablet TAKE 1 TABLET BY MOUTH DAILY  . Multiple Vitamins-Minerals (MULTIVITAMIN GUMMIES ADULTS PO) Take by mouth daily.   No facility-administered encounter medications on file as of 11/17/2018.     Activities of Daily Living In  your present state of health, do you have any difficulty performing the following activities: 11/17/2018  Hearing? N  Vision? N  Difficulty concentrating or making decisions? N  Walking or climbing stairs? N  Dressing or bathing? N  Doing errands, shopping? N  Preparing Food and eating ? N  Using the Toilet? N  In the past six months, have you accidently leaked urine? N  Do you have problems with loss of bowel control? N  Managing your Medications? N  Managing your Finances? N  Housekeeping or managing your Housekeeping? N  Some recent data might be hidden    Patient Care Team: Owens Loffler, MD as PCP - General   Assessment:   This is a routine wellness examination for Randy Boyle.   Hearing Screening   125Hz  250Hz  500Hz  1000Hz  2000Hz  3000Hz  4000Hz  6000Hz  8000Hz   Right ear:   40 40 40  0    Left ear:   40 40 40  40      Visual Acuity Screening   Right eye Left eye Both eyes  Without correction:     With correction: 20/20 20/20 20/20     Exercise Activities and Dietary recommendations Current Exercise Habits: Home exercise routine, Type of exercise: walking, Time (Minutes): 30, Frequency (Times/Week): 7, Weekly Exercise (Minutes/Week): 210, Intensity: Mild, Exercise limited by: None identified  Goals    . Increase physical activity     Starting 11/17/2018, I will continue to walk at least 30 minutes daily.        Fall Risk Fall Risk  11/17/2018 11/10/2017 08/19/2016 04/18/2014  Falls in the past year? 0 No No No   Depression Screen PHQ 2/9 Scores 11/17/2018 11/10/2017 08/19/2016 04/18/2014  PHQ - 2 Score 0 0 0 0  PHQ- 9 Score 0 0 - -    Cognitive Function MMSE - Mini Mental State Exam 11/17/2018 11/10/2017  Orientation to time 5 5  Orientation to Place 5 5  Registration 3 3  Attention/ Calculation 0 0  Recall 3 3  Language- name 2 objects 0 0  Language- repeat 1 1  Language- follow 3 step command 3 3  Language- read & follow direction 0 0  Write a sentence 0 0  Copy  design 0 0  Total score 20 20     PLEASE NOTE: A Mini-Cog screen was completed. Maximum score is 20. A value of 0 denotes this part of [place] MMSE was not completed or the patient failed this part of the Mini-Cog screening.   Mini-Cog Screening Orientation to Time - Max 5 pts Orientation to Place - Max 5 pts Registration - Max 3 pts Recall - Max 3 pts  Language Repeat - Max 1 pts Language Follow 3 Step Command - Max 3 pts     Immunization History  Administered Date(s) Administered  . Td 03/09/2008      Screening Tests Health Maintenance  Topic Date Due  . INFLUENZA VACCINE  12/20/2018 (Originally 04/21/2018)  . PNA vac Low Risk Adult (1 of 2 - PCV13) 12/20/2018 (Originally 04/19/2014)  . TETANUS/TDAP  09/20/2020 (Originally 03/09/2018)  . COLONOSCOPY  11/07/2020  . Hepatitis C Screening  Completed     Plan:  I have personally reviewed, addressed, and noted the following in the patient's chart:  A. Medical and social history B. Use of alcohol, tobacco or illicit drugs  C. Current medications and supplements D. Functional ability and status E.  Nutritional status F.  Physical activity G. Advance directives H. List of other physicians I.  Hospitalizations, surgeries, and ER visits in previous 12 months [place] to include hearing, vision, cognitive, depression L. Referrals and appointments - none  In addition, I have reviewed and discussed with patient certain preventive protocols, quality metrics, and best practice recommendations. A written personalized care plan for preventive services as well as general preventive health recommendations were provided to patient.  See attached scanned questionnaire for additional information.   Signed,   Lindell Noe, MHA, BS, LPN Health Coach

## 2018-11-18 LAB — PSA, TOTAL AND FREE
PSA, % Free: 19 % (calc) — ABNORMAL LOW (ref >25)
PSA, Free: 0.8 ng/mL
PSA, Total: 4.3 ng/mL — ABNORMAL HIGH (ref <=4.0)

## 2018-11-18 LAB — CEA: CEA: 1.4 ng/mL

## 2018-11-20 NOTE — Progress Notes (Signed)
Dr. Frederico Hamman T. Dontray Haberland, MD, [facility] [place], [address] Phone: [phone] Fax: [phone]  11/23/2018  Patient: Randy Boyle, MRN: [id number], DOB: [birth year], 68 y.o.  Primary Physician:  Owens Loffler, MD   Chief Complaint  Patient presents with  . Annual Exam    Part 2   Subjective:   Randy Boyle is a 68 y.o. pleasant patient who presents with the following:  Preventative Health Maintenance Visit:  Health Maintenance Summary Reviewed and updated, unless pt declines services.  Tobacco History Reviewed. Alcohol: No concerns, no excessive use Exercise Habits: Some activity, rec at least 30 mins 5 times a week STD concerns: no risk or activity to increase risk Drug Use: None Encouraged self-testicular check  Minicog screen 20/20  2022 colon recall per stark  Declines all vaccines   Health Maintenance  Topic Date Due  . INFLUENZA VACCINE  12/20/2018 (Originally 04/21/2018)  . PNA vac Low Risk Adult (1 of 2 - PCV13) 12/20/2018 (Originally 04/19/2014)  . TETANUS/TDAP  09/20/2020 (Originally 03/09/2018)  . COLONOSCOPY  11/07/2020  . Hepatitis C Screening  Completed   Immunization History  Administered Date(s) Administered  . Td 03/09/2008   Patient Active Problem List   Diagnosis Date Noted  . Irritability and anger 04/18/2014  . Personal history of colon cancer, stage II   . OTHER MALAISE AND FATIGUE 03/18/2009  . Hyperlipidemia LDL goal <70 01/01/2009  . Essential hypertension 01/01/2009  . DIVERTICULITIS, HX OF 01/01/2009   Past Medical History:  Diagnosis Date  . Diverticulitis   . HLD (hyperlipidemia)   . HTN (hypertension)   . Hx of pancreatitis   . Personal history of colon cancer, stage II    Adenocarcinoma   Past Surgical History:  Procedure Laterality Date  . COLONOSCOPY    . HEMICOLECTOMY  07/2006   Social History   Socioeconomic History  . Marital status: Married   Spouse name: Not on file  . Number of children: 2  . Years of education: Not on file  . Highest education level: Not on file  Occupational History  . Occupation: Retired  Scientific laboratory technician  . Financial resource strain: Not on file  . Food insecurity:    Worry: Not on file    Inability: Not on file  . Transportation needs:    Medical: Not on file    Non-medical: Not on file  Tobacco Use  . Smoking status: Never Smoker  . Smokeless tobacco: Never Used  Substance and Sexual Activity  . Alcohol use: Yes    Comment: beer occassionally  . Drug use: No  . Sexual activity: Not on file  Lifestyle  . Physical activity:    Days per week: Not on file    Minutes per session: Not on file  . Stress: Not on file  Relationships  . Social connections:    Talks on phone: Not on file    Gets together: Not on file    Attends religious service: Not on file    Active member of club or organization: Not on file    Attends meetings of clubs or organizations: Not on file    Relationship status: Not on file  . Intimate partner violence:    Fear of current or ex partner: Not on file    Emotionally abused: Not on file    Physically abused: Not on file    Forced sexual activity: Not on file  Other Topics Concern  . Not on file  Social History Narrative   [employer] Witness   Married   Family History  Problem Relation Age of Onset  . Colon cancer Neg Hx   . Esophageal cancer Neg Hx   . Rectal cancer Neg Hx   . Stomach cancer Neg Hx    Allergies  Allergen Reactions  . Oxycodone-Acetaminophen     REACTION: hallucinations  . Penicillins     REACTION: reacted as a child    Medication list has been reviewed and updated.   General: Denies fever, chills, sweats. No significant weight loss. Eyes: Denies blurring,significant itching ENT: Denies earache, sore throat, and hoarseness. Cardiovascular: Denies chest pains, palpitations, dyspnea on exertion Respiratory: Denies cough, dyspnea at  rest,wheeezing Breast: no concerns about lumps GI: Denies nausea, vomiting, diarrhea, constipation, change in bowel habits, abdominal pain, melena, hematochezia GU: Denies penile discharge, ED, urinary flow / outflow problems. No STD concerns. Musculoskeletal: Denies back pain, joint pain Derm: Denies rash, itching Neuro: Denies  paresthesias, frequent falls, frequent headaches Psych: Denies depression, anxiety Endocrine: Denies cold intolerance, heat intolerance, polydipsia Heme: Denies enlarged lymph nodes Allergy: No hayfever  Objective:   BP 130/70   Pulse 73   Temp 99 F (37.2 C) (Oral)   Ht 5' 9.5" (1.765 m)   Wt 201 lb (91.2 kg)   SpO2 95%   BMI 29.26 kg/m  Ideal Body Weight: Weight in (lb) to have BMI = 25: 171.4  No exam data present  GEN: well developed, well nourished, no acute distress Eyes: conjunctiva and lids normal, PERRLA, EOMI ENT: TM clear, nares clear, oral exam WNL Neck: supple, no lymphadenopathy, no thyromegaly, no JVD Pulm: clear to auscultation and percussion, respiratory effort normal CV: regular rate and rhythm, S1-S2, no murmur, rub or gallop, no bruits, peripheral pulses normal and symmetric, no cyanosis, clubbing, edema or varicosities GI: soft, non-tender; no hepatosplenomegaly, masses; active bowel sounds all quadrants GU: no hernia, testicular mass, penile discharge Lymph: no cervical, axillary or inguinal adenopathy MSK: gait normal, muscle tone and strength WNL, no joint swelling, effusions, discoloration, crepitus  SKIN: clear, good turgor, color WNL, no rashes, lesions, or ulcerations Neuro: normal mental status, normal strength, sensation, and motion Psych: alert; oriented to person, place and time, normally interactive and not anxious or depressed in appearance. All labs reviewed with patient.  Lipids: Lab Results  Component Value Date   CHOL 195 11/17/2018   Lab Results  Component Value Date   HDL 58.00 11/17/2018   Lab  Results  Component Value Date   LDLCALC 113 (H) 11/17/2018   Lab Results  Component Value Date   TRIG 119.0 11/17/2018   Lab Results  Component Value Date   CHOLHDL 3 11/17/2018   CBC: CBC Latest Ref Rng & Units 11/17/2018 11/10/2017 08/19/2016  WBC 4.0 - 10.5 K/uL 6.7 8.3 15.6(H)  Hemoglobin 13.0 - 17.0 g/dL 15.4 15.6 15.3  Hematocrit 39.0 - 52.0 % 44.7 45.8 44.8  Platelets 150.0 - 400.0 K/uL 282.0 336.0 Result may be falsely decreased due to platelet clumping. 151.7    Basic Metabolic Panel:    Component Value Date/Time   NA 139 11/17/2018 0808   K 4.3 11/17/2018 0808   CL 101 11/17/2018 0808   CO2 30 11/17/2018 0808   BUN 19 11/17/2018 0808   CREATININE 1.29 11/17/2018 0808   GLUCOSE 111 (H) 11/17/2018 0808   CALCIUM 10.2 11/17/2018 0808   Hepatic Function Latest Ref Rng & Units  11/17/2018 01/21/2018 11/10/2017  Total Protein 6.0 - 8.3 g/dL 7.9 7.5 7.6  Albumin 3.5 - 5.2 g/dL 5.0 4.7 4.6  AST 0 - 37 U/L 33 39(H) 30  ALT 0 - 53 U/L 23 53 34  Alk Phosphatase 39 - 117 U/L 49 49 77  Total Bilirubin 0.2 - 1.2 mg/dL 0.4 0.4 0.5  Bilirubin, Direct 0.0 - 0.3 mg/dL 0.1 0.1 0.1    Lab Results  Component Value Date   HGBA1C 5.9 11/17/2018   Lab Results  Component Value Date   TSH 1.86 12/29/2010   Lab Results  Component Value Date   PSA 4.35 (H) 11/10/2017   PSA 5.70 (H) 08/19/2016   PSA 4.57 (H) 04/10/2014    Assessment and Plan:   Healthcare maintenance  Doing well, eating well, exercising Unfortunately, declines all vaccines  Health Maintenance Exam: The patient's preventative maintenance and recommended screening tests for an annual wellness exam were reviewed in full today. Brought up to date unless services declined.  Counselled on the importance of diet, exercise, and its role in overall health and mortality. The patient's FH and SH was reviewed, including their home life, tobacco status, and drug and alcohol status.  Follow-up in 1 year for physical  exam or additional follow-up below.  Follow-up: No follow-ups on file. Or follow-up in 1 year if not noted.  Signed,  Maud Deed. Mohmmad Saleeby, MD   Allergies as of 11/23/2018      Reactions   Oxycodone-acetaminophen    REACTION: hallucinations   Penicillins    REACTION: reacted as a child      Medication List       Accurate as of November 23, 2018  8:12 AM. Always use your most recent med list.        fenofibrate 160 MG tablet TAKE 1 TABLET BY MOUTH ONCE DAILY   FLUoxetine 20 MG capsule Commonly known as:  PROZAC TAKE 1 CAPSULE BY MOUTH ONCE DAILY   losartan 50 MG tablet Commonly known as:  COZAAR TAKE 1 TABLET BY MOUTH DAILY   MULTIVITAMIN GUMMIES ADULTS PO Take by mouth daily.

## 2018-11-23 ENCOUNTER — Ambulatory Visit (INDEPENDENT_AMBULATORY_CARE_PROVIDER_SITE_OTHER): Payer: PPO | Admitting: Family Medicine

## 2018-11-23 ENCOUNTER — Encounter: Payer: Self-pay | Admitting: Family Medicine

## 2018-11-23 VITALS — BP 130/70 | HR 73 | Temp 99.0°F | Ht 69.5 in | Wt 201.0 lb

## 2018-11-23 DIAGNOSIS — Z0000 Encounter for general adult medical examination without abnormal findings: Secondary | ICD-10-CM

## 2019-01-04 ENCOUNTER — Other Ambulatory Visit: Payer: Self-pay | Admitting: Family Medicine

## 2019-02-08 ENCOUNTER — Other Ambulatory Visit: Payer: Self-pay | Admitting: Family Medicine

## 2019-05-11 ENCOUNTER — Other Ambulatory Visit: Payer: Self-pay | Admitting: Family Medicine

## 2019-11-28 ENCOUNTER — Ambulatory Visit: Payer: PPO

## 2019-12-04 ENCOUNTER — Encounter: Payer: PPO | Admitting: Family Medicine

## 2020-02-01 ENCOUNTER — Other Ambulatory Visit: Payer: Self-pay | Admitting: Family Medicine

## 2020-02-01 NOTE — Telephone Encounter (Signed)
Please schedule MWV with Nurse and CPE with Dr. Copland. 

## 2020-02-12 NOTE — Telephone Encounter (Signed)
lvm asking pt to call office °

## 2020-02-14 ENCOUNTER — Ambulatory Visit (INDEPENDENT_AMBULATORY_CARE_PROVIDER_SITE_OTHER): Payer: PPO

## 2020-02-14 DIAGNOSIS — Z0000 Encounter for general adult medical examination without abnormal findings: Secondary | ICD-10-CM | POA: Diagnosis not present

## 2020-02-14 NOTE — Patient Instructions (Signed)
Mr. Randy Boyle , Thank you for taking time to come for your Medicare Wellness Visit. I appreciate your ongoing commitment to your health goals. Please review the following plan we discussed and let me know if I can assist you in the future.   Screening recommendations/referrals: Colonoscopy: Up to date, completed 11/08/2015 Recommended yearly ophthalmology/optometry visit for glaucoma screening and checkup Recommended yearly dental visit for hygiene and checkup  Vaccinations: Influenza vaccine: Fall 2021 Pneumococcal vaccine: due Tdap vaccine: decline Shingles vaccine: discussed    Advanced directives: Please bring a copy of your POA (Power of Attorney) and/or Living Will to your next appointment.   Conditions/risks identified: none  Next appointment: 02/22/2020 @ 2:40 pm   Preventive Care 65 Years and Older, Male Preventive care refers to lifestyle choices and visits with your health care provider that can promote health and wellness. What does preventive care include?  A yearly physical exam. This is also called an annual well check.  Dental exams once or twice a year.  Routine eye exams. Ask your health care provider how often you should have your eyes checked.  Personal lifestyle choices, including:  Daily care of your teeth and gums.  Regular physical activity.  Eating a healthy diet.  Avoiding tobacco and drug use.  Limiting alcohol use.  Practicing safe sex.  Taking low doses of aspirin every day.  Taking vitamin and mineral supplements as recommended by your health care provider. What happens during an annual well check? The services and screenings done by your health care provider during your annual well check will depend on your age, overall health, lifestyle risk factors, and family history of disease. Counseling  Your health care provider may ask you questions about your:  Alcohol use.  Tobacco use.  Drug use.  Emotional well-being.  Home and  relationship well-being.  Sexual activity.  Eating habits.  History of falls.  Memory and ability to understand (cognition).  Work and work Statistician. Screening  You may have the following tests or measurements:  Height, weight, and BMI.  Blood pressure.  Lipid and cholesterol levels. These may be checked every 5 years, or more frequently if you are over 25 years old.  Skin check.  Lung cancer screening. You may have this screening every year starting at age 28 if you have a 30-pack-year history of smoking and currently smoke or have quit within the past 15 years.  Fecal occult blood test (FOBT) of the stool. You may have this test every year starting at age 7.  Flexible sigmoidoscopy or colonoscopy. You may have a sigmoidoscopy every 5 years or a colonoscopy every 10 years starting at age 25.  Prostate cancer screening. Recommendations will vary depending on your family history and other risks.  Hepatitis C blood test.  Hepatitis B blood test.  Sexually transmitted disease (STD) testing.  Diabetes screening. This is done by checking your blood sugar (glucose) after you have not eaten for a while (fasting). You may have this done every 1-3 years.  Abdominal aortic aneurysm (AAA) screening. You may need this if you are a current or former smoker.  Osteoporosis. You may be screened starting at age 71 if you are at high risk. Talk with your health care provider about your test results, treatment options, and if necessary, the need for more tests. Vaccines  Your health care provider may recommend certain vaccines, such as:  Influenza vaccine. This is recommended every year.  Tetanus, diphtheria, and acellular pertussis (Tdap, Td) vaccine. You  may need a Td booster every 10 years.  Zoster vaccine. You may need this after age 27.  Pneumococcal 13-valent conjugate (PCV13) vaccine. One dose is recommended after age 30.  Pneumococcal polysaccharide (PPSV23) vaccine. One  dose is recommended after age 71. Talk to your health care provider about which screenings and vaccines you need and how often you need them. This information is not intended to replace advice given to you by your health care provider. Make sure you discuss any questions you have with your health care provider. Document Released: 10/04/2015 Document Revised: 05/27/2016 Document Reviewed: 07/09/2015 Elsevier Interactive Patient Education  2017 [place] Prevention in the Home Falls can cause injuries. They can happen to people of all ages. There are many things you can do to make your home safe and to help prevent falls. What can I do on the outside of my home?  Regularly fix the edges of walkways and driveways and fix any cracks.  Remove anything that might make you trip as you walk through a door, such as a raised step or threshold.  Trim any bushes or trees on the path to your home.  Use bright outdoor lighting.  Clear any walking paths of anything that might make someone trip, such as rocks or tools.  Regularly check to see if handrails are loose or broken. Make sure that both sides of any steps have handrails.  Any raised decks and porches should have guardrails on the edges.  Have any leaves, snow, or ice cleared regularly.  Use sand or salt on walking paths during winter.  Clean up any spills in your garage right away. This includes oil or grease spills. What can I do in the bathroom?  Use night lights.  Install grab bars by the toilet and in the tub and shower. Do not use towel bars as grab bars.  Use non-skid mats or decals in the tub or shower.  If you need to sit down in the shower, use a plastic, non-slip stool.  Keep the floor dry. Clean up any water that spills on the floor as soon as it happens.  Remove soap buildup in the tub or shower regularly.  Attach bath mats securely with double-sided non-slip rug tape.  Do not have throw rugs and other  things on the floor that can make you trip. What can I do in the bedroom?  Use night lights.  Make sure that you have a light by your bed that is easy to reach.  Do not use any sheets or blankets that are too big for your bed. They should not hang down onto the floor.  Have a firm chair that has side arms. You can use this for support while you get dressed.  Do not have throw rugs and other things on the floor that can make you trip. What can I do in the kitchen?  Clean up any spills right away.  Avoid walking on wet floors.  Keep items that you use a lot in easy-to-reach places.  If you need to reach something above you, use a strong step stool that has a grab bar.  Keep electrical cords out of the way.  Do not use floor polish or wax that makes floors slippery. If you must use wax, use non-skid floor wax.  Do not have throw rugs and other things on the floor that can make you trip. What can I do with my stairs?  Do not leave any items  on the stairs.  Make sure that there are handrails on both sides of the stairs and use them. Fix handrails that are broken or loose. Make sure that handrails are as long as the stairways.  Check any carpeting to make sure that it is firmly attached to the stairs. Fix any carpet that is loose or worn.  Avoid having throw rugs at the top or bottom of the stairs. If you do have throw rugs, attach them to the floor with carpet tape.  Make sure that you have a light switch at the top of the stairs and the bottom of the stairs. If you do not have them, ask someone to add them for you. What else can I do to help prevent falls?  Wear shoes that:  Do not have high heels.  Have rubber bottoms.  Are comfortable and fit you well.  Are closed at the toe. Do not wear sandals.  If you use a stepladder:  Make sure that it is fully opened. Do not climb a closed stepladder.  Make sure that both sides of the stepladder are locked into place.  Ask  someone to hold it for you, if possible.  Clearly mark and make sure that you can see:  Any grab bars or handrails.  First and last steps.  Where the edge of each step is.  Use tools that help you move around (mobility aids) if they are needed. These include:  Canes.  Walkers.  Scooters.  Crutches.  Turn on the lights when you go into a dark area. Replace any light bulbs as soon as they burn out.  Set up your furniture so you have a clear path. Avoid moving your furniture around.  If any of your floors are uneven, fix them.  If there are any pets around you, be aware of where they are.  Review your medicines with your doctor. Some medicines can make you feel dizzy. This can increase your chance of falling. Ask your doctor what other things that you can do to help prevent falls. This information is not intended to replace advice given to you by your health care provider. Make sure you discuss any questions you have with your health care provider. Document Released: 07/04/2009 Document Revised: 02/13/2016 Document Reviewed: 10/12/2014 Elsevier Interactive Patient Education  2017 [employer].

## 2020-02-14 NOTE — Progress Notes (Signed)
Subjective:   Randy Boyle is a 69 y.o. male who presents for Medicare Annual/Subsequent preventive examination.  Review of Systems: N/A   I connected with the patient today by telephone and verified that I am speaking with the correct person using two identifiers. Location patient: home Location nurse: work Persons participating in the virtual visit: patient, Randy Boyle.   I discussed the limitations, risks, security and privacy concerns of performing an evaluation and management service by telephone and the availability of in person appointments. I also discussed with the patient that there may be a patient responsible charge related to this service. The patient expressed understanding and verbally consented to this telephonic visit.    Interactive audio and video telecommunications were attempted between this nurse and patient, however failed, due to patient having technical difficulties OR patient did not have access to video capability.  We continued and completed visit with audio only.     Cardiac Risk Factors include: advanced age (>48men, >29 women);male gender     Objective:    Vitals: There were no vitals taken for this visit.  There is no height or weight on file to calculate BMI.  Advanced Directives 02/14/2020 11/17/2018 11/10/2017 11/08/2015 10/25/2015  Does Patient Have a Medical Advance Directive? Yes Yes Yes Yes Yes  Type of Paramedic of [place];Living will Healthcare Power of [place];Living will Healthcare Power of [place];Living will  Does patient want to make changes to medical advance directive? - - - - No - Patient declined  Copy of [place] in Chart? No - copy requested (No Data) No - copy requested - No - copy requested    Tobacco Social History   Tobacco Use  Smoking Status Never Smoker  Smokeless Tobacco Never Used     Counseling given: Not  Answered   Clinical Intake:  Pre-visit preparation completed: Yes  Pain : No/denies pain     Nutritional Risks: None Diabetes: No  How often do you need to have someone help you when you read instructions, pamphlets, or other written materials from your doctor or pharmacy?: 1 - Never  Interpreter Needed?: No  Information entered by :: CJohnson, LPN  Past Medical History:  Diagnosis Date  . Diverticulitis   . HLD (hyperlipidemia)   . HTN (hypertension)   . Hx of pancreatitis   . Personal history of colon cancer, stage II    Adenocarcinoma   Past Surgical History:  Procedure Laterality Date  . COLONOSCOPY    . HEMICOLECTOMY  07/2006   Family History  Problem Relation Age of Onset  . Colon cancer Neg Hx   . Esophageal cancer Neg Hx   . Rectal cancer Neg Hx   . Stomach cancer Neg Hx    Social History   Socioeconomic History  . Marital status: Married    Spouse name: Not on file  . Number of children: 2  . Years of education: Not on file  . Highest education level: Not on file  Occupational History  . Occupation: Retired  Tobacco Use  . Smoking status: Never Smoker  . Smokeless tobacco: Never Used  Substance and Sexual Activity  . Alcohol use: Yes    Comment: beer occassionally  . Drug use: No  . Sexual activity: Not on file  Other Topics Concern  . Not on file  Social History Narrative   J[employer] Witness   Married   Social Determinants of Health  Financial Resource Strain: Low Risk   . Difficulty of Paying Living Expenses: Not hard at all  Food Insecurity: No Food Insecurity  . Worried About Charity fundraiser in the Last Year: Never true  . Ran Out of Food in the Last Year: Never true  Transportation Needs: No Transportation Needs  . Lack of Transportation (Medical): No  . Lack of Transportation (Non-Medical): No  Physical Activity: Sufficiently Active  . Days of Exercise per Week: 7 days  . Minutes of Exercise per Session: 30 min  Stress:  No Stress Concern Present  . Feeling of Stress : Not at all  Social Connections:   . Frequency of Communication with Friends and Family:   . Frequency of Social Gatherings with Friends and Family:   . Attends Religious Services:   . Active Member of Clubs or Organizations:   . Attends Archivist Meetings:   Marland Kitchen Marital Status:     Outpatient Encounter Medications as of 02/14/2020  Medication Sig  . fenofibrate 160 MG tablet Take 1 tablet by mouth once daily  . FLUoxetine (PROZAC) 20 MG capsule Take 1 capsule by mouth once daily  . losartan (COZAAR) 50 MG tablet Take 1 tablet by mouth once daily  . Multiple Vitamins-Minerals (MULTIVITAMIN GUMMIES ADULTS PO) Take by mouth daily.   No facility-administered encounter medications on file as of 02/14/2020.    Activities of Daily Living In your present state of health, do you have any difficulty performing the following activities: 02/14/2020  Hearing? N  Vision? N  Difficulty concentrating or making decisions? N  Walking or climbing stairs? N  Dressing or bathing? N  Doing errands, shopping? N  Preparing Food and eating ? N  Using the Toilet? N  In the past six months, have you accidently leaked urine? N  Do you have problems with loss of bowel control? N  Managing your Medications? N  Managing your Finances? N  Housekeeping or managing your Housekeeping? N  Some recent data might be hidden    Patient Care Team: Owens Loffler, MD as PCP - General   Assessment:   This is a routine wellness examination for Randy Boyle.  Exercise Activities and Dietary recommendations Current Exercise Habits: Home exercise routine, Type of exercise: walking, Time (Minutes): 30, Frequency (Times/Week): 7, Weekly Exercise (Minutes/Week): 210, Intensity: Moderate, Exercise limited by: None identified  Goals    . Increase physical activity     Starting 11/17/2018, I will continue to walk at least 30 minutes daily.     . Patient Stated      02/14/2020, I will continue to walk everyday for 30 minutes.        Fall Risk Fall Risk  02/14/2020 11/17/2018 11/10/2017 08/19/2016 04/18/2014  Falls in the past year? 0 0 No No No  Number falls in past yr: 0 - - - -  Injury with Fall? 0 - - - -  Risk for fall due to : Medication side effect - - - -  Follow up Falls evaluation completed;Falls prevention discussed - - - -   Is the patient's home free of loose throw rugs in walkways, pet beds, electrical cords, etc?   yes      Grab bars in the bathroom? no      Handrails on the stairs?   yes      Adequate lighting?   yes  Timed Get Up and Go Performed: N/A  Depression Screen [facility] 2/9 Scores 02/14/2020 11/17/2018 11/10/2017 08/19/2016  PHQ - 2 Score 0 0 0 0  PHQ- 9 Score 0 0 0 -    Cognitive Function MMSE - Mini Mental State Exam 02/14/2020 11/17/2018 11/10/2017  Orientation to time 5 5 5   Orientation to Place 5 5 5   Registration 3 3 3   Attention/ Calculation 5 0 0  Recall 3 3 3   Language- name 2 objects - 0 0  Language- repeat 1 1 1   Language- follow 3 step command - 3 3  Language- read & follow direction - 0 0  Write a sentence - 0 0  Copy design - 0 0  Total score - 20 20  Mini Cog  Mini-Cog screen was completed. Maximum score is 22. A value of 0 denotes this part of the MMSE was not completed or the patient failed this part of the Mini-Cog screening.       Immunization History  Administered Date(s) Administered  . Td 03/09/2008    Qualifies for Shingles Vaccine: Yes   Screening Tests Health Maintenance  Topic Date Due  . COVID-19 Vaccine (1) Never done  . PNA vac Low Risk Adult (1 of 2 - PCV13) Never done  . TETANUS/TDAP  09/20/2020 (Originally 03/09/2018)  . INFLUENZA VACCINE  04/21/2020  . COLONOSCOPY  11/07/2020  . Hepatitis C Screening  Completed   Cancer Screenings: Lung: Low Dose CT Chest recommended if Age 69-80 years, 30 pack-year currently smoking OR have quit w/in 15 years. Patient does not  qualify. Colorectal: completed 11/08/2015  Additional Screenings:  Hepatitis C Screening: 11/10/2017      Plan:   Patient will continue to walk everyday for 30 minutes.   I have personally reviewed and noted the following in the patient's chart:   . Medical and social history . Use of alcohol, tobacco or illicit drugs  . Current medications and supplements . Functional ability and status . Nutritional status . Physical activity . Advanced directives . List of other physicians . Hospitalizations, surgeries, and ER visits in previous 12 months . Vitals . Screenings to include cognitive, depression, and falls . Referrals and appointments  In addition, I have reviewed and discussed with patient certain preventive protocols, quality metrics, and best practice recommendations. A written personalized care plan for preventive services as well as general preventive health recommendations were provided to patient.     Lashad, Pallanes, LPN  [id number]

## 2020-02-14 NOTE — Progress Notes (Signed)
PCP notes:  Health Maintenance: Prevnar 13- due   Abnormal Screenings: none   Patient concerns: none   Nurse concerns: none   Next PCP appt.: 02/22/2020 @ 2:40 pm

## 2020-02-15 ENCOUNTER — Other Ambulatory Visit (INDEPENDENT_AMBULATORY_CARE_PROVIDER_SITE_OTHER): Payer: PPO

## 2020-02-15 DIAGNOSIS — E785 Hyperlipidemia, unspecified: Secondary | ICD-10-CM

## 2020-02-15 DIAGNOSIS — R972 Elevated prostate specific antigen [PSA]: Secondary | ICD-10-CM | POA: Diagnosis not present

## 2020-02-15 DIAGNOSIS — Z85038 Personal history of other malignant neoplasm of large intestine: Secondary | ICD-10-CM | POA: Diagnosis not present

## 2020-02-15 DIAGNOSIS — Z79899 Other long term (current) drug therapy: Secondary | ICD-10-CM

## 2020-02-15 NOTE — Addendum Note (Signed)
Addended by: Ellamae Sia on: 02/15/2020 03:57 PM   Modules accepted: Orders

## 2020-02-16 LAB — HEPATIC FUNCTION PANEL
ALT: 20 U/L (ref 0–53)
AST: 24 U/L (ref 0–37)
Albumin: 4.8 g/dL (ref 3.5–5.2)
Alkaline Phosphatase: 44 U/L (ref 39–117)
Bilirubin, Direct: 0.1 mg/dL (ref 0.0–0.3)
Total Bilirubin: 0.3 mg/dL (ref 0.2–1.2)
Total Protein: 7.5 g/dL (ref 6.0–8.3)

## 2020-02-16 LAB — LDL CHOLESTEROL, DIRECT: Direct LDL: 102 mg/dL

## 2020-02-16 LAB — LIPID PANEL
Cholesterol: 188 mg/dL (ref 0–200)
HDL: 49.4 mg/dL (ref >39.00)
NonHDL: 138.15
Total CHOL/HDL Ratio: 4
Triglycerides: 260 mg/dL — ABNORMAL HIGH (ref 0.0–149.0)
VLDL: 52 mg/dL — ABNORMAL HIGH (ref 0.0–40.0)

## 2020-02-16 LAB — BASIC METABOLIC PANEL
BUN: 18 mg/dL (ref 6–23)
CO2: 27 mEq/L (ref 19–32)
Calcium: 9.8 mg/dL (ref 8.4–10.5)
Chloride: 103 mEq/L (ref 96–112)
Creatinine, Ser: 1.35 mg/dL (ref 0.40–1.50)
GFR: 52.12 mL/min — ABNORMAL LOW (ref >60.00)
Glucose, Bld: 128 mg/dL — ABNORMAL HIGH (ref 70–99)
Potassium: 4.2 mEq/L (ref 3.5–5.1)
Sodium: 138 mEq/L (ref 135–145)

## 2020-02-16 LAB — CBC WITH DIFFERENTIAL/PLATELET
Basophils Absolute: 0.1 10*3/uL (ref 0.0–0.1)
Basophils Relative: 0.9 % (ref 0.0–3.0)
Eosinophils Absolute: 0.2 10*3/uL (ref 0.0–0.7)
Eosinophils Relative: 2.5 % (ref 0.0–5.0)
HCT: 43.2 % (ref 39.0–52.0)
Hemoglobin: 14.2 g/dL (ref 13.0–17.0)
Lymphocytes Relative: 19.8 % (ref 12.0–46.0)
Lymphs Abs: 1.3 10*3/uL (ref 0.7–4.0)
MCHC: 32.8 g/dL (ref 30.0–36.0)
MCV: 86.4 fl (ref 78.0–100.0)
Monocytes Absolute: 0.7 10*3/uL (ref 0.1–1.0)
Monocytes Relative: 9.9 % (ref 3.0–12.0)
Neutro Abs: 4.5 10*3/uL (ref 1.4–7.7)
Neutrophils Relative %: 66.9 % (ref 43.0–77.0)
Platelets: 311 10*3/uL (ref 150.0–400.0)
RBC: 5 Mil/uL (ref 4.22–5.81)
RDW: 14 % (ref 11.5–15.5)
WBC: 6.7 10*3/uL (ref 4.0–10.5)

## 2020-02-16 LAB — PSA, TOTAL WITH REFLEX TO PSA, FREE: PSA, Total: 3.6 ng/mL (ref <=4.0)

## 2020-02-16 LAB — CEA: CEA: 1.9 ng/mL

## 2020-02-22 ENCOUNTER — Ambulatory Visit (INDEPENDENT_AMBULATORY_CARE_PROVIDER_SITE_OTHER): Payer: PPO | Admitting: Family Medicine

## 2020-02-22 ENCOUNTER — Other Ambulatory Visit: Payer: Self-pay

## 2020-02-22 ENCOUNTER — Encounter: Payer: Self-pay | Admitting: Family Medicine

## 2020-02-22 VITALS — BP 112/72 | HR 83 | Temp 98.2°F | Ht 68.25 in | Wt 198.0 lb

## 2020-02-22 DIAGNOSIS — Z0000 Encounter for general adult medical examination without abnormal findings: Secondary | ICD-10-CM

## 2020-02-22 DIAGNOSIS — C4441 Basal cell carcinoma of skin of scalp and neck: Secondary | ICD-10-CM | POA: Diagnosis not present

## 2020-02-22 NOTE — Progress Notes (Signed)
Michell Giuliano T. Louanna Vanliew, MD, [place] at [facility] [place], [address]  Phone: [phone]   FAX: [place] - 69 y.o. male   MRN [id number]   Date of Birth: 08/23/1951  Date: 02/22/2020   PCP: Owens Loffler, MD   Referral: Owens Loffler, MD  Chief Complaint  Patient presents with   Annual Exam    Part 2    This visit occurred during the SARS-CoV-2 public health emergency.  Safety protocols were in place, including screening questions prior to the visit, additional usage of staff PPE, and extensive cleaning of exam room while observing appropriate contact time as indicated for disinfecting solutions.   Patient Care Team: Owens Loffler, MD as PCP - General Subjective:   Randy Boyle is a 69 y.o. pleasant patient who presents with the following:  Preventative Health Maintenance Visit:  Health Maintenance Summary Reviewed and updated, unless pt declines services.  Tobacco History Reviewed. Alcohol: No concerns, no excessive use Exercise Habits: Some activity, rec at least 30 mins 5 times a week STD concerns: no risk or activity to increase risk Drug Use: None  Covid vaccine  He has generally been eating more healthy.  He and his wife are making an effort.  Health Maintenance  Topic Date Due   COVID-19 Vaccine (1) Never done   PNA vac Low Risk Adult (1 of 2 - PCV13) Never done   TETANUS/TDAP  09/20/2020 (Originally 03/09/2018)   INFLUENZA VACCINE  04/21/2020   COLONOSCOPY  11/07/2020   Hepatitis C Screening  Completed   Immunization History  Administered Date(s) Administered   Td 03/09/2008   Patient Active Problem List   Diagnosis Date Noted   Personal history of colon cancer, stage II     Priority: High   Hyperlipidemia LDL goal <70 01/01/2009    Priority: Medium   Essential hypertension 01/01/2009    Priority: Medium    DIVERTICULITIS, HX OF 01/01/2009    Past Medical History:  Diagnosis Date   Diverticulitis    HLD (hyperlipidemia)    HTN (hypertension)    Hx of pancreatitis    Personal history of colon cancer, stage II    Adenocarcinoma    Past Surgical History:  Procedure Laterality Date   COLONOSCOPY     HEMICOLECTOMY  07/2006    Family History  Problem Relation Age of Onset   Colon cancer Neg Hx    Esophageal cancer Neg Hx    Rectal cancer Neg Hx    Stomach cancer Neg Hx     Past Medical History, Surgical History, Social History, Family History, Problem List, Medications, and Allergies have been reviewed and updated if relevant.  Review of Systems: Pertinent positives are listed above.  Otherwise, a full 14 point review of systems has been done in full and it is negative except where it is noted positive.  Objective:   BP 112/72    Pulse 83    Temp 98.2 F (36.8 C) (Temporal)    Ht 5' 8.25" (1.734 m)    Wt 198 lb (89.8 kg)    SpO2 95%    BMI 29.89 kg/m  Ideal Body Weight: Weight in (lb) to have BMI = 25: 165.3  Ideal Body Weight: Weight in (lb) to have BMI = 25: 165.3 No exam data present Depression screen [facility] 2/9 02/14/2020 11/17/2018 11/10/2017 08/19/2016 04/18/2014  Decreased Interest 0 0 0  0 0  Down, Depressed, Hopeless 0 0 0 0 0  PHQ - 2 Score 0 0 0 0 0  Altered sleeping 0 0 0 - -  Tired, decreased energy 0 0 0 - -  Change in appetite 0 0 0 - -  Feeling bad or failure about yourself  0 0 0 - -  Trouble concentrating 0 0 0 - -  Moving slowly or fidgety/restless 0 0 0 - -  Suicidal thoughts 0 0 0 - -  PHQ-9 Score 0 0 0 - -  Difficult doing work/chores Not difficult at all Not difficult at all Not difficult at all - -     GEN: well developed, well nourished, no acute distress Eyes: conjunctiva and lids normal, PERRLA, EOMI ENT: TM clear, nares clear, oral exam WNL Neck: supple, no lymphadenopathy, no thyromegaly, no JVD Pulm: clear to auscultation and  percussion, respiratory effort normal CV: regular rate and rhythm, S1-S2, no murmur, rub or gallop, no bruits, peripheral pulses normal and symmetric, no cyanosis, clubbing, edema or varicosities GI: soft, non-tender; no hepatosplenomegaly, masses; active bowel sounds all quadrants GU: no hernia, testicular mass, penile discharge Lymph: no cervical, axillary or inguinal adenopathy MSK: gait normal, muscle tone and strength WNL, no joint swelling, effusions, discoloration, crepitus  SKIN: On the left side of his head on the brow he does have a pearly area with some superficial ulcerations.   Neuro: normal mental status, normal strength, sensation, and motion Psych: alert; oriented to person, place and time, normally interactive and not anxious or depressed in appearance.  All labs reviewed with patient. Results for orders placed or performed in visit on 02/15/20  CEA  Result Value Ref Range   CEA 1.9 ng/mL  PSA, Total with Reflex to PSA, Free  Result Value Ref Range   PSA, Total 3.6 < OR = 4.0 ng/mL  Basic metabolic panel  Result Value Ref Range   Sodium 138 135 - 145 mEq/L   Potassium 4.2 3.5 - 5.1 mEq/L   Chloride 103 96 - 112 mEq/L   CO2 27 19 - 32 mEq/L   Glucose, Bld 128 (H) 70 - 99 mg/dL   BUN 18 6 - 23 mg/dL   Creatinine, Ser 1.35 0.40 - 1.50 mg/dL   GFR 52.12 (L) >60.00 mL/min   Calcium 9.8 8.4 - 10.5 mg/dL  Hepatic function panel  Result Value Ref Range   Total Bilirubin 0.3 0.2 - 1.2 mg/dL   Bilirubin, Direct 0.1 0.0 - 0.3 mg/dL   Alkaline Phosphatase 44 39 - 117 U/L   AST 24 0 - 37 U/L   ALT 20 0 - 53 U/L   Total Protein 7.5 6.0 - 8.3 g/dL   Albumin 4.8 3.5 - 5.2 g/dL  CBC with Differential/Platelet  Result Value Ref Range   WBC 6.7 4.0 - 10.5 K/uL   RBC 5.00 4.22 - 5.81 Mil/uL   Hemoglobin 14.2 13.0 - 17.0 g/dL   HCT 43.2 39.0 - 52.0 %   MCV 86.4 78.0 - 100.0 fl   MCHC 32.8 30.0 - 36.0 g/dL   RDW 14.0 11.5 - 15.5 %   Platelets 311.0 150.0 - 400.0 K/uL    Neutrophils Relative % 66.9 43.0 - 77.0 %   Lymphocytes Relative 19.8 12.0 - 46.0 %   Monocytes Relative 9.9 3.0 - 12.0 %   Eosinophils Relative 2.5 0.0 - 5.0 %   Basophils Relative 0.9 0.0 - 3.0 %   Neutro Abs 4.5 1.4 -  7.7 K/uL   Lymphs Abs 1.3 0.7 - 4.0 K/uL   Monocytes Absolute 0.7 0.1 - 1.0 K/uL   Eosinophils Absolute 0.2 0.0 - 0.7 K/uL   Basophils Absolute 0.1 0.0 - 0.1 K/uL  Lipid panel  Result Value Ref Range   Cholesterol 188 0 - 200 mg/dL   Triglycerides 260.0 (H) 0.0 - 149.0 mg/dL   HDL 49.40 >39.00 mg/dL   VLDL 52.0 (H) 0.0 - 40.0 mg/dL   Total CHOL/HDL Ratio 4    NonHDL 138.15   LDL cholesterol, direct  Result Value Ref Range   Direct LDL 102.0 mg/dL    Assessment and Plan:     ICD-10-CM   1. Healthcare maintenance  Z00.00   2. Basal cell carcinoma (BCC) of head  C44.41 Ambulatory referral to Dermatology   Forehead, skin, high level concern for basal cell carcinoma and I am going to have him see dermatology.  Otherwise he is doing quite well, and I encouraged him about his diet and activity level.  Health Maintenance Exam: The patient's preventative maintenance and recommended screening tests for an annual wellness exam were reviewed in full today. Brought up to date unless services declined.  Counselled on the importance of diet, exercise, and its role in overall health and mortality. The patient's FH and SH was reviewed, including their home life, tobacco status, and drug and alcohol status.  Follow-up in 1 year for physical exam or additional follow-up below.  Follow-up: No follow-ups on file. Or follow-up in 1 year if not noted.  No orders of the defined types were placed in this encounter.  There are no discontinued medications. Orders Placed This Encounter  Procedures   Ambulatory referral to Dermatology    Signed,  Frederico Hamman T. Tejon Gracie, MD   Allergies as of 02/22/2020      Reactions   Oxycodone-acetaminophen    REACTION: hallucinations    Penicillins    REACTION: reacted as a child      Medication List       Accurate as of February 22, 2020 11:59 PM. If you have any questions, ask your nurse or doctor.        fenofibrate 160 MG tablet Take 1 tablet by mouth once daily   FLUoxetine 20 MG capsule Commonly known as: PROZAC Take 1 capsule by mouth once daily   losartan 50 MG tablet Commonly known as: COZAAR Take 1 tablet by mouth once daily   MULTIVITAMIN GUMMIES ADULTS PO Take by mouth daily.

## 2020-03-04 ENCOUNTER — Ambulatory Visit: Payer: PPO | Admitting: Dermatology

## 2020-03-04 ENCOUNTER — Other Ambulatory Visit: Payer: Self-pay

## 2020-03-04 DIAGNOSIS — Z1283 Encounter for screening for malignant neoplasm of skin: Secondary | ICD-10-CM | POA: Diagnosis not present

## 2020-03-04 DIAGNOSIS — C44319 Basal cell carcinoma of skin of other parts of face: Secondary | ICD-10-CM

## 2020-03-04 DIAGNOSIS — D492 Neoplasm of unspecified behavior of bone, soft tissue, and skin: Secondary | ICD-10-CM

## 2020-03-04 DIAGNOSIS — D229 Melanocytic nevi, unspecified: Secondary | ICD-10-CM | POA: Diagnosis not present

## 2020-03-04 DIAGNOSIS — L570 Actinic keratosis: Secondary | ICD-10-CM

## 2020-03-04 DIAGNOSIS — D1801 Hemangioma of skin and subcutaneous tissue: Secondary | ICD-10-CM | POA: Diagnosis not present

## 2020-03-04 DIAGNOSIS — L578 Other skin changes due to chronic exposure to nonionizing radiation: Secondary | ICD-10-CM

## 2020-03-04 DIAGNOSIS — L814 Other melanin hyperpigmentation: Secondary | ICD-10-CM

## 2020-03-04 DIAGNOSIS — C4491 Basal cell carcinoma of skin, unspecified: Secondary | ICD-10-CM

## 2020-03-04 DIAGNOSIS — L821 Other seborrheic keratosis: Secondary | ICD-10-CM | POA: Diagnosis not present

## 2020-03-04 DIAGNOSIS — C44310 Basal cell carcinoma of skin of unspecified parts of face: Secondary | ICD-10-CM

## 2020-03-04 HISTORY — DX: Basal cell carcinoma of skin, unspecified: C44.91

## 2020-03-04 NOTE — Progress Notes (Signed)
New Patient Visit  Subjective  Randy Boyle is a 69 y.o. male who presents for the following: Skin Problem (check red spot on his L forehead x 6 months ) and Annual Exam (UBSE ). The patient presents for Upper Body Skin Exam (UBSE) for skin cancer screening and mole check.   The following portions of the chart were reviewed this encounter and updated as appropriate:  Tobacco  Allergies  Meds  Problems  Med Hx  Surg Hx  Fam Hx      Review of Systems:  No other skin or systemic complaints except as noted in HPI or Assessment and Plan.  Objective  Well appearing patient in no apparent distress; mood and affect are within normal limits.  All skin waist up examined.  Objective  L forehead above mid brow: 0.8 cm red papule   Objective  Head - Anterior (Face) (2): Erythematous thin papules/macules with gritty scale.   Assessment & Plan   Lentigines - Scattered tan macules - Discussed due to sun exposure - Benign, observe - Call for any changes  Seborrheic Keratoses - Stuck-on, waxy, tan-brown papules and plaques  - Discussed benign etiology and prognosis. - Observe - Call for any changes  Melanocytic Nevi - Tan-brown and/or pink-flesh-colored symmetric macules and papules - Benign appearing on exam today - Observation - Call clinic for new or changing moles - Recommend daily use of broad spectrum spf 30+ sunscreen to sun-exposed areas.   Hemangiomas - Red papules - Discussed benign nature - Observe - Call for any changes  Actinic Damage - diffuse scaly erythematous macules with underlying dyspigmentation - Recommend daily broad spectrum sunscreen SPF 30+ to sun-exposed areas, reapply every 2 hours as needed.  - Call for new or changing lesions.  Skin cancer screening performed today.  Neoplasm of skin L forehead above mid brow  Epidermal / dermal shaving  Lesion diameter (cm):  0.8 Informed consent: discussed and consent obtained   Timeout: patient  name, date of birth, surgical site, and procedure verified   Procedure prep:  Patient was prepped and draped in usual sterile fashion Prep type:  Isopropyl alcohol Anesthesia: the lesion was anesthetized in a standard fashion   Anesthetic:  1% lidocaine w/ epinephrine 1-100,000 buffered w/ 8.4% NaHCO3 Hemostasis achieved with: pressure, aluminum chloride and electrodesiccation   Outcome: patient tolerated procedure well   Post-procedure details: sterile dressing applied and wound care instructions given   Dressing type: bandage and petrolatum    Destruction of lesion Complexity: extensive   Destruction method: electrodesiccation and curettage   Informed consent: discussed and consent obtained   Timeout:  patient name, date of birth, surgical site, and procedure verified Procedure prep:  Patient was prepped and draped in usual sterile fashion Prep type:  Isopropyl alcohol Anesthesia: the lesion was anesthetized in a standard fashion   Anesthetic:  1% lidocaine w/ epinephrine 1-100,000 buffered w/ 8.4% NaHCO3 Curettage performed in three different directions: Yes   Electrodesiccation performed over the curetted area: Yes   Lesion length (cm):  0.8 Lesion width (cm):  0.8 Margin per side (cm):  0.2 Final wound size (cm):  1.2 Hemostasis achieved with:  pressure, aluminum chloride and electrodesiccation Outcome: patient tolerated procedure well with no complications   Post-procedure details: sterile dressing applied and wound care instructions given   Dressing type: bandage and petrolatum   Additional details:  Post defect 1.2 cm   Specimen 1 - Surgical pathology Differential Diagnosis: R/O BCC Check Margins: No  AK (actinic keratosis) (2) Head - Anterior (Face)  Destruction of lesion - Head - Anterior (Face) Complexity: simple   Destruction method: cryotherapy   Informed consent: discussed and consent obtained   Timeout:  patient name, date of birth, surgical site, and  procedure verified Lesion destroyed using liquid nitrogen: Yes   Region frozen until ice ball extended beyond lesion: Yes   Outcome: patient tolerated procedure well with no complications   Post-procedure details: wound care instructions given    Skin cancer screening  Return in about 7 months (around 10/04/2020) for Aks.  IMarye Round, CMA, am acting as scribe for Sarina Ser, MD .  Documentation: I have reviewed the above documentation for accuracy and completeness, and I agree with the above.  Sarina Ser, MD

## 2020-03-04 NOTE — Patient Instructions (Signed)

## 2020-03-06 ENCOUNTER — Encounter: Payer: Self-pay | Admitting: Dermatology

## 2020-03-06 ENCOUNTER — Telehealth: Payer: Self-pay

## 2020-03-06 NOTE — Telephone Encounter (Signed)
Patient advised of biopsy results.

## 2020-03-06 NOTE — Telephone Encounter (Signed)
-----   Message from Ralene Bathe, MD sent at 03/05/2020  7:06 PM EDT ----- Skin , left forehead above mid brow BASAL CELL CARCINOMA, NODULAR PATTERN  Cancer - BCC Already treated Recheck next visit

## 2020-03-06 NOTE — Telephone Encounter (Signed)
Left message on machine for patient to return my call 

## 2020-03-22 ENCOUNTER — Other Ambulatory Visit: Payer: Self-pay | Admitting: Family Medicine

## 2020-04-25 ENCOUNTER — Ambulatory Visit: Payer: PPO | Admitting: Dermatology

## 2020-05-06 ENCOUNTER — Other Ambulatory Visit: Payer: Self-pay | Admitting: Family Medicine

## 2020-09-27 ENCOUNTER — Other Ambulatory Visit: Payer: Self-pay | Admitting: Family Medicine

## 2020-10-02 ENCOUNTER — Encounter: Payer: PPO | Admitting: Dermatology

## 2020-10-09 ENCOUNTER — Encounter: Payer: PPO | Admitting: Dermatology

## 2020-11-04 ENCOUNTER — Encounter: Payer: Self-pay | Admitting: Gastroenterology

## 2021-03-30 ENCOUNTER — Other Ambulatory Visit: Payer: Self-pay | Admitting: Family Medicine

## 2021-03-31 NOTE — Telephone Encounter (Signed)
Please schedule MWV with Calandra and CPE with fasting labs prior for Dr. Lorelei Pont.

## 2021-03-31 NOTE — Telephone Encounter (Signed)
Called pt to schedule appts. Pt did not answer so I left a voicemail.

## 2021-04-30 ENCOUNTER — Other Ambulatory Visit (INDEPENDENT_AMBULATORY_CARE_PROVIDER_SITE_OTHER): Payer: PPO

## 2021-04-30 ENCOUNTER — Other Ambulatory Visit: Payer: Self-pay

## 2021-04-30 DIAGNOSIS — E785 Hyperlipidemia, unspecified: Secondary | ICD-10-CM | POA: Diagnosis not present

## 2021-04-30 DIAGNOSIS — Z125 Encounter for screening for malignant neoplasm of prostate: Secondary | ICD-10-CM | POA: Diagnosis not present

## 2021-04-30 DIAGNOSIS — Z79899 Other long term (current) drug therapy: Secondary | ICD-10-CM

## 2021-04-30 DIAGNOSIS — R7303 Prediabetes: Secondary | ICD-10-CM | POA: Diagnosis not present

## 2021-04-30 LAB — BASIC METABOLIC PANEL
BUN: 14 mg/dL (ref 6–23)
CO2: 24 mEq/L (ref 19–32)
Calcium: 10 mg/dL (ref 8.4–10.5)
Chloride: 102 mEq/L (ref 96–112)
Creatinine, Ser: 1.21 mg/dL (ref 0.40–1.50)
GFR: 60.02 mL/min (ref >60.00)
Glucose, Bld: 95 mg/dL (ref 70–99)
Potassium: 4.1 mEq/L (ref 3.5–5.1)
Sodium: 138 mEq/L (ref 135–145)

## 2021-04-30 LAB — HEMOGLOBIN A1C: Hgb A1c MFr Bld: 6.2 % (ref 4.6–6.5)

## 2021-04-30 LAB — LIPID PANEL
Cholesterol: 200 mg/dL (ref 0–200)
HDL: 52.2 mg/dL (ref >39.00)
LDL Cholesterol: 112 mg/dL — ABNORMAL HIGH (ref 0–99)
NonHDL: 147.78
Total CHOL/HDL Ratio: 4
Triglycerides: 181 mg/dL — ABNORMAL HIGH (ref 0.0–149.0)
VLDL: 36.2 mg/dL (ref 0.0–40.0)

## 2021-04-30 LAB — CBC WITH DIFFERENTIAL/PLATELET
Basophils Absolute: 0.1 10*3/uL (ref 0.0–0.1)
Basophils Relative: 1 % (ref 0.0–3.0)
Eosinophils Absolute: 0.1 10*3/uL (ref 0.0–0.7)
Eosinophils Relative: 2.1 % (ref 0.0–5.0)
HCT: 45.7 % (ref 39.0–52.0)
Hemoglobin: 15.1 g/dL (ref 13.0–17.0)
Lymphocytes Relative: 16.9 % (ref 12.0–46.0)
Lymphs Abs: 1.2 10*3/uL (ref 0.7–4.0)
MCHC: 33.1 g/dL (ref 30.0–36.0)
MCV: 86.3 fl (ref 78.0–100.0)
Monocytes Absolute: 0.6 10*3/uL (ref 0.1–1.0)
Monocytes Relative: 8.7 % (ref 3.0–12.0)
Neutro Abs: 4.9 10*3/uL (ref 1.4–7.7)
Neutrophils Relative %: 71.3 % (ref 43.0–77.0)
Platelets: 271 10*3/uL (ref 150.0–400.0)
RBC: 5.3 Mil/uL (ref 4.22–5.81)
RDW: 14 % (ref 11.5–15.5)
WBC: 6.9 10*3/uL (ref 4.0–10.5)

## 2021-04-30 LAB — PSA, MEDICARE: PSA: 3.44 ng/ml (ref 0.10–4.00)

## 2021-05-06 ENCOUNTER — Other Ambulatory Visit: Payer: Self-pay | Admitting: Family Medicine

## 2021-05-06 NOTE — Progress Notes (Signed)
Randy Boyle T. Randy Cienfuegos, MD, [place] at [facility] [place], [address]  Phone: [phone]  FAX: [phone]  Randy Boyle - 70 y.o. male  MRN [id number]  Date of Birth: Feb 12, 1951  Date: 05/07/2021  PCP: Randy Loffler, MD  Referral: Randy Loffler, MD  Chief Complaint  Patient presents with   Medicare Wellness    This visit occurred during the SARS-CoV-2 public health emergency.  Safety protocols were in place, including screening questions prior to the visit, additional usage of staff PPE, and extensive cleaning of exam room while observing appropriate contact time as indicated for disinfecting solutions.   Patient Care Team: Randy Loffler, MD as PCP - General Subjective:   Randy Boyle is a 70 y.o. pleasant patient who presents for a medicare wellness examination:  Preventative Health Maintenance Visit:  Health Maintenance Summary Reviewed and updated, unless pt declines services.  Tobacco History Reviewed. Alcohol: No concerns, no excessive use Exercise Habits: walking x 7 d STD concerns: no risk or activity to increase risk Drug Use: None  Shingrix - no Tdap - no Prevnar - 20 - no  Colonoscopy - ?? Following with GI or Oncology Will have follow-up  Health Maintenance  Topic Date Due   Zoster Vaccines- Shingrix (1 of 2) Never done   PNA vac Low Risk Adult (1 of 2 - PCV13) Never done   TETANUS/TDAP  03/09/2018   COLONOSCOPY (Pts 45-78yr Insurance coverage will need to be confirmed)  11/07/2020   COVID-19 Vaccine (4 - Booster for Pfizer series) 02/01/2021   INFLUENZA VACCINE  04/21/2021   Hepatitis C Screening  Completed   HPV VACCINES  Aged Out    Immunization History  Administered Date(s) Administered   PFIZER(Purple Top)SARS-COV-2 Vaccination 03/30/2020, 04/20/2020, 11/04/2020   Td 03/09/2008    Patient Active Problem List   Diagnosis Date Noted   Personal history of colon cancer,  stage II     Priority: High   Hyperlipidemia LDL goal <70 01/01/2009    Priority: Medium   Essential hypertension 01/01/2009    Priority: Medium   DIVERTICULITIS, HX OF 01/01/2009    Past Medical History:  Diagnosis Date   Diverticulitis    HLD (hyperlipidemia)    HTN (hypertension)    Hx of pancreatitis    Personal history of colon cancer, stage II    Adenocarcinoma    Past Surgical History:  Procedure Laterality Date   COLONOSCOPY     HEMICOLECTOMY  07/2006    Family History  Problem Relation Age of Onset   Colon cancer Neg Hx    Esophageal cancer Neg Hx    Rectal cancer Neg Hx    Stomach cancer Neg Hx     Past Medical History, Surgical History, Social History, Family History, Problem List, Medications, and Allergies have been reviewed and updated if relevant.  Review of Systems: Pertinent positives are listed above.  Otherwise, a full 14 point review of systems has been done in full and it is negative except where it is noted positive.  Objective:   BP 130/70   Pulse 83   Temp 98.2 F (36.8 C) (Temporal)   Ht 5' 8.5" (1.74 m)   Wt 206 lb 8 oz (93.7 kg)   SpO2 96%   BMI 30.94 kg/m  Fall Risk  05/07/2021 02/14/2020 11/17/2018 11/10/2017 08/19/2016  Falls in the past year? 0 0 0 No No  Number falls in past yr: - 0 - - -  Injury with Fall? - 0 - - -  Risk for fall due to : - Medication side effect - - -  Follow up - Falls evaluation completed;Falls prevention discussed - - -   Ideal Body Weight: Weight in (lb) to have BMI = 25: 166.5 Hearing Screening  Method: Audiometry   '500Hz'$  '1000Hz'$  '2000Hz'$  '4000Hz'$   Right ear '20 20 20 '$ 0  Left ear '20 20 20 '$ 0   Vision Screening   Right eye Left eye Both eyes  Without correction     With correction '20/13 20/13 20/13 '$   Depression screen [facility] 2/9 05/07/2021 02/14/2020 11/17/2018 11/10/2017 08/19/2016  Decreased Interest 0 0 0 0 0  Down, Depressed, Hopeless 0 0 0 0 0  PHQ - 2 Score 0 0 0 0 0  Altered sleeping - 0 0 0 -  Tired,  decreased energy - 0 0 0 -  Change in appetite - 0 0 0 -  Feeling bad or failure about yourself  - 0 0 0 -  Trouble concentrating - 0 0 0 -  Moving slowly or fidgety/restless - 0 0 0 -  Suicidal thoughts - 0 0 0 -  PHQ-9 Score - 0 0 0 -  Difficult doing work/chores - Not difficult at all Not difficult at all Not difficult at all -     GEN: well developed, well nourished, no acute distress Eyes: conjunctiva and lids normal, PERRLA, EOMI ENT: TM clear, nares clear, oral exam WNL Neck: supple, no lymphadenopathy, no thyromegaly, no JVD Pulm: clear to auscultation and percussion, respiratory effort normal CV: regular rate and rhythm, S1-S2, no murmur, rub or gallop, no bruits, peripheral pulses normal and symmetric, no cyanosis, clubbing, edema or varicosities GI: soft, non-tender; no hepatosplenomegaly, masses; active bowel sounds all quadrants GU: deferred Lymph: no cervical, axillary or inguinal adenopathy MSK: gait normal, muscle tone and strength WNL, no joint swelling, effusions, discoloration, crepitus  SKIN: clear, good turgor, color WNL, no rashes, lesions, or ulcerations Neuro: normal mental status, normal strength, sensation, and motion Psych: alert; oriented to person, place and time, normally interactive and not anxious or depressed in appearance.  All labs reviewed with patient.  Results for orders placed or performed in visit on 04/30/21  Lipid panel  Result Value Ref Range   Cholesterol 200 0 - 200 mg/dL   Triglycerides 181.0 (H) 0.0 - 149.0 mg/dL   HDL 52.20 >39.00 mg/dL   VLDL 36.2 0.0 - 40.0 mg/dL   LDL Cholesterol 112 (H) 0 - 99 mg/dL   Total CHOL/HDL Ratio 4    NonHDL [id number]   Basic metabolic panel  Result Value Ref Range   Sodium 138 135 - 145 mEq/L   Potassium 4.1 3.5 - 5.1 mEq/L   Chloride 102 96 - 112 mEq/L   CO2 24 19 - 32 mEq/L   Glucose, Bld 95 70 - 99 mg/dL   BUN 14 6 - 23 mg/dL   Creatinine, Ser 1.21 0.40 - 1.50 mg/dL   GFR 60.02 >60.00 mL/min    Calcium 10.0 8.4 - 10.5 mg/dL  CBC with Differential/Platelet  Result Value Ref Range   WBC 6.9 4.0 - 10.5 K/uL   RBC 5.30 4.22 - 5.81 Mil/uL   Hemoglobin 15.1 13.0 - 17.0 g/dL   HCT 45.7 39.0 - 52.0 %   MCV 86.3 78.0 - 100.0 fl   MCHC 33.1 30.0 - 36.0 g/dL   RDW 14.0 11.5 - 15.5 %   Platelets 271.0 150.0 - 400.0 K/uL  Neutrophils Relative % 71.3 43.0 - 77.0 %   Lymphocytes Relative 16.9 12.0 - 46.0 %   Monocytes Relative 8.7 3.0 - 12.0 %   Eosinophils Relative 2.1 0.0 - 5.0 %   Basophils Relative 1.0 0.0 - 3.0 %   Neutro Abs 4.9 1.4 - 7.7 K/uL   Lymphs Abs 1.2 0.7 - 4.0 K/uL   Monocytes Absolute 0.6 0.1 - 1.0 K/uL   Eosinophils Absolute 0.1 0.0 - 0.7 K/uL   Basophils Absolute 0.1 0.0 - 0.1 K/uL  Hemoglobin A1c  Result Value Ref Range   Hgb A1c MFr Bld 6.2 4.6 - 6.5 %  PSA, Medicare  Result Value Ref Range   PSA 3.44 0.10 - 4.00 ng/ml    Assessment and Plan:     ICD-10-CM   1. Healthcare maintenance  Z00.00     2. Personal history of colon cancer, stage II  Z85.038 Ambulatory referral to Gastroenterology    3. Screen for colon cancer  Z12.11 Ambulatory referral to Gastroenterology     Globally he is doing ok, declines Shingrix, Prevnar, Tdap vaccines.   With 2007 adenocarcinoma of the Colon, will consult GI for follow-up  Health Maintenance Exam: The patient's preventative maintenance and recommended screening tests for an annual wellness exam were reviewed in full today. Brought up to date unless services declined.  Counselled on the importance of diet, exercise, and its role in overall health and mortality. The patient's FH and SH was reviewed, including their home life, tobacco status, and drug and alcohol status.  Follow-up in 1 year for physical exam or additional follow-up below.  I have personally reviewed the Medicare Annual Wellness questionnaire and have noted 1. The patient's medical and social history 2. Their use of alcohol, tobacco or illicit  drugs 3. Their current medications and supplements 4. The patient's functional ability including ADL's, fall risks, home safety risks and hearing or visual             impairment. 5. Diet and physical activities 6. Evidence for depression or mood disorders 7. Reviewed Updated provider list, see scanned forms and [facility] Snapshot.  8. Reviewed whether or not the patient has HCPOA or living will, and discussed what this means with the patient.  Recommended he bring in a copy for his chart in [facility].  The patients weight, height, BMI and visual acuity have been recorded in the chart I have made referrals, counseling and provided education to the patient based review of the above and I have provided the pt with a written personalized care plan for preventive services.  I have provided the patient with a copy of your personalized plan for preventive services. Instructed to take the time to review along with their updated medication list.  Follow-up: No follow-ups on file. Or follow-up in 1 year if not noted.  No future appointments.   No orders of the defined types were placed in this encounter.  There are no discontinued medications. Orders Placed This Encounter  Procedures   Ambulatory referral to Gastroenterology     Signed,  Frederico Hamman T. Zyler Hyson, MD   Allergies as of 05/07/2021       Reactions   Oxycodone-acetaminophen    REACTION: hallucinations   Penicillins    REACTION: reacted as a child        Medication List        Accurate as of May 07, 2021  9:10 AM. If you have any questions, ask your nurse or doctor.  fenofibrate 160 MG tablet Take 1 tablet by mouth once daily   FLUoxetine 20 MG capsule Commonly known as: PROZAC Take 1 capsule by mouth once daily   losartan 50 MG tablet Commonly known as: COZAAR Take 1 tablet by mouth once daily   MULTIVITAMIN GUMMIES ADULTS PO Take by mouth daily.

## 2021-05-07 ENCOUNTER — Ambulatory Visit (INDEPENDENT_AMBULATORY_CARE_PROVIDER_SITE_OTHER): Payer: PPO | Admitting: Family Medicine

## 2021-05-07 ENCOUNTER — Encounter: Payer: Self-pay | Admitting: Family Medicine

## 2021-05-07 ENCOUNTER — Other Ambulatory Visit: Payer: Self-pay

## 2021-05-07 VITALS — BP 130/70 | HR 83 | Temp 98.2°F | Ht 68.5 in | Wt 206.5 lb

## 2021-05-07 DIAGNOSIS — Z0000 Encounter for general adult medical examination without abnormal findings: Secondary | ICD-10-CM | POA: Diagnosis not present

## 2021-05-07 DIAGNOSIS — Z85038 Personal history of other malignant neoplasm of large intestine: Secondary | ICD-10-CM

## 2021-05-07 DIAGNOSIS — Z1211 Encounter for screening for malignant neoplasm of colon: Secondary | ICD-10-CM | POA: Diagnosis not present

## 2021-07-06 ENCOUNTER — Other Ambulatory Visit: Payer: Self-pay | Admitting: Family Medicine

## 2021-07-30 ENCOUNTER — Other Ambulatory Visit: Payer: Self-pay | Admitting: Family Medicine

## 2021-11-05 ENCOUNTER — Other Ambulatory Visit: Payer: Self-pay | Admitting: Family Medicine

## 2022-02-01 ENCOUNTER — Other Ambulatory Visit: Payer: Self-pay | Admitting: Family Medicine

## 2022-02-05 ENCOUNTER — Other Ambulatory Visit: Payer: Self-pay | Admitting: Family Medicine

## 2022-04-02 ENCOUNTER — Other Ambulatory Visit: Payer: Self-pay | Admitting: Family Medicine

## 2022-04-02 ENCOUNTER — Other Ambulatory Visit: Payer: PPO

## 2022-04-02 NOTE — Telephone Encounter (Signed)
SCHEDULED

## 2022-04-02 NOTE — Telephone Encounter (Signed)
Please schedule Medicare Wellness with nurse and CPE with fasting labs prior with Dr. Lorelei Pont for sometime around 05/07/22.

## 2022-05-08 ENCOUNTER — Other Ambulatory Visit: Payer: PPO

## 2022-05-08 ENCOUNTER — Ambulatory Visit (INDEPENDENT_AMBULATORY_CARE_PROVIDER_SITE_OTHER): Payer: PPO

## 2022-05-08 VITALS — Ht 69.0 in | Wt 195.0 lb

## 2022-05-08 DIAGNOSIS — Z0000 Encounter for general adult medical examination without abnormal findings: Secondary | ICD-10-CM

## 2022-05-08 NOTE — Patient Instructions (Signed)
Randy Boyle , Thank you for taking time to come for your Medicare Wellness Visit. I appreciate your ongoing commitment to your health goals. Please review the following plan we discussed and let me know if I can assist you in the future.   Screening recommendations/referrals: Colonoscopy: Done 11/08/2015 - Repeat in 5 years *due - call for appointment soon Recommended yearly ophthalmology/optometry visit for glaucoma screening and checkup Recommended yearly dental visit for hygiene and checkup  Vaccinations: declines all Influenza vaccine: recommend every Fall Pneumococcal vaccine: recommend once per lifetime Prevnar-20 Tdap vaccine: recommend every 10 years Shingles vaccine: recommend Shingrix which is 2 doses 2-6 months apart and over 90% effective     Covid-19: Done 7//06/2020, 04/20/2020, 11/04/2020, & 03/2022  Advanced directives: Please bring a copy of your health care power of attorney and living will to the office to be added to your chart at your convenience.   Conditions/risks identified:  Keep up the great work! Aim for 30 minutes of exercise or brisk walking, 6-8 glasses of water, and 5 servings of fruits and vegetables each day.   Next appointment: Follow up in one year for your annual wellness visit.   Preventive Care 28 Years and Older, Male  Preventive care refers to lifestyle choices and visits with your health care provider that can promote health and wellness. What does preventive care include? A yearly physical exam. This is also called an annual well check. Dental exams once or twice a year. Routine eye exams. Ask your health care provider how often you should have your eyes checked. Personal lifestyle choices, including: Daily care of your teeth and gums. Regular physical activity. Eating a healthy diet. Avoiding tobacco and drug use. Limiting alcohol use. Practicing safe sex. Taking low doses of aspirin every day. Taking vitamin and mineral supplements as  recommended by your health care provider. What happens during an annual well check? The services and screenings done by your health care provider during your annual well check will depend on your age, overall health, lifestyle risk factors, and family history of disease. Counseling  Your health care provider may ask you questions about your: Alcohol use. Tobacco use. Drug use. Emotional well-being. Home and relationship well-being. Sexual activity. Eating habits. History of falls. Memory and ability to understand (cognition). Work and work Statistician. Screening  You may have the following tests or measurements: Height, weight, and BMI. Blood pressure. Lipid and cholesterol levels. These may be checked every 5 years, or more frequently if you are over 48 years old. Skin check. Lung cancer screening. You may have this screening every year starting at age 81 if you have a 30-pack-year history of smoking and currently smoke or have quit within the past 15 years. Fecal occult blood test (FOBT) of the stool. You may have this test every year starting at age 78. Flexible sigmoidoscopy or colonoscopy. You may have a sigmoidoscopy every 5 years or a colonoscopy every 10 years starting at age 52. Prostate cancer screening. Recommendations will vary depending on your family history and other risks. Hepatitis C blood test. Hepatitis B blood test. Sexually transmitted disease (STD) testing. Diabetes screening. This is done by checking your blood sugar (glucose) after you have not eaten for a while (fasting). You may have this done every 1-3 years. Abdominal aortic aneurysm (AAA) screening. You may need this if you are a current or former smoker. Osteoporosis. You may be screened starting at age 66 if you are at high risk. Talk with your  health care provider about your test results, treatment options, and if necessary, the need for more tests. Vaccines  Your health care provider may recommend  certain vaccines, such as: Influenza vaccine. This is recommended every year. Tetanus, diphtheria, and acellular pertussis (Tdap, Td) vaccine. You may need a Td booster every 10 years. Zoster vaccine. You may need this after age 66. Pneumococcal 13-valent conjugate (PCV13) vaccine. One dose is recommended after age 66. Pneumococcal polysaccharide (PPSV23) vaccine. One dose is recommended after age 56. Talk to your health care provider about which screenings and vaccines you need and how often you need them. This information is not intended to replace advice given to you by your health care provider. Make sure you discuss any questions you have with your health care provider. Document Released: 10/04/2015 Document Revised: 05/27/2016 Document Reviewed: 07/09/2015 Elsevier Interactive Patient Education  2017 [place] Prevention in the Home Falls can cause injuries. They can happen to people of all ages. There are many things you can do to make your home safe and to help prevent falls. What can I do on the outside of my home? Regularly fix the edges of walkways and driveways and fix any cracks. Remove anything that might make you trip as you walk through a door, such as a raised step or threshold. Trim any bushes or trees on the path to your home. Use bright outdoor lighting. Clear any walking paths of anything that might make someone trip, such as rocks or tools. Regularly check to see if handrails are loose or broken. Make sure that both sides of any steps have handrails. Any raised decks and porches should have guardrails on the edges. Have any leaves, snow, or ice cleared regularly. Use sand or salt on walking paths during winter. Clean up any spills in your garage right away. This includes oil or grease spills. What can I do in the bathroom? Use night lights. Install grab bars by the toilet and in the tub and shower. Do not use towel bars as grab bars. Use non-skid mats or  decals in the tub or shower. If you need to sit down in the shower, use a plastic, non-slip stool. Keep the floor dry. Clean up any water that spills on the floor as soon as it happens. Remove soap buildup in the tub or shower regularly. Attach bath mats securely with double-sided non-slip rug tape. Do not have throw rugs and other things on the floor that can make you trip. What can I do in the bedroom? Use night lights. Make sure that you have a light by your bed that is easy to reach. Do not use any sheets or blankets that are too big for your bed. They should not hang down onto the floor. Have a firm chair that has side arms. You can use this for support while you get dressed. Do not have throw rugs and other things on the floor that can make you trip. What can I do in the kitchen? Clean up any spills right away. Avoid walking on wet floors. Keep items that you use a lot in easy-to-reach places. If you need to reach something above you, use a strong step stool that has a grab bar. Keep electrical cords out of the way. Do not use floor polish or wax that makes floors slippery. If you must use wax, use non-skid floor wax. Do not have throw rugs and other things on the floor that can make you trip. What  can I do with my stairs? Do not leave any items on the stairs. Make sure that there are handrails on both sides of the stairs and use them. Fix handrails that are broken or loose. Make sure that handrails are as long as the stairways. Check any carpeting to make sure that it is firmly attached to the stairs. Fix any carpet that is loose or worn. Avoid having throw rugs at the top or bottom of the stairs. If you do have throw rugs, attach them to the floor with carpet tape. Make sure that you have a light switch at the top of the stairs and the bottom of the stairs. If you do not have them, ask someone to add them for you. What else can I do to help prevent falls? Wear shoes that: Do not  have high heels. Have rubber bottoms. Are comfortable and fit you well. Are closed at the toe. Do not wear sandals. If you use a stepladder: Make sure that it is fully opened. Do not climb a closed stepladder. Make sure that both sides of the stepladder are locked into place. Ask someone to hold it for you, if possible. Clearly mark and make sure that you can see: Any grab bars or handrails. First and last steps. Where the edge of each step is. Use tools that help you move around (mobility aids) if they are needed. These include: Canes. Walkers. Scooters. Crutches. Turn on the lights when you go into a dark area. Replace any light bulbs as soon as they burn out. Set up your furniture so you have a clear path. Avoid moving your furniture around. If any of your floors are uneven, fix them. If there are any pets around you, be aware of where they are. Review your medicines with your doctor. Some medicines can make you feel dizzy. This can increase your chance of falling. Ask your doctor what other things that you can do to help prevent falls. This information is not intended to replace advice given to you by your health care provider. Make sure you discuss any questions you have with your health care provider. Document Released: 07/04/2009 Document Revised: 02/13/2016 Document Reviewed: 10/12/2014 Elsevier Interactive Patient Education  2017 Elsevier Inc[employer]  

## 2022-05-08 NOTE — Progress Notes (Signed)
Subjective:   Randy Boyle is a 71 y.o. male who presents for Medicare Annual/Subsequent preventive examination.  Virtual Visit via Telephone Note  I connected with  Randy Boyle on 05/08/22 at 12:00 PM EDT by telephone and verified that I am speaking with the correct person using two identifiers.  Location: Patient: Home Provider: [facility] Persons participating in the virtual visit: patient/Nurse Health Advisor   I discussed the limitations, risks, security and privacy concerns of performing an evaluation and management service by telephone and the availability of in person appointments. The patient expressed understanding and agreed to proceed.  Interactive audio and video telecommunications were attempted between this nurse and patient, however failed, due to patient having technical difficulties OR patient did not have access to video capability.  We continued and completed visit with audio only.  Some vital signs may be absent or patient reported.   Randy Boyle E Randy Runkles, LPN   Review of Systems     Cardiac Risk Factors include: advanced age (>57mn, >>24women);male gender;dyslipidemia;hypertension     Objective:    Today's Vitals   05/08/22 1212  Weight: 195 lb (88.5 kg)  Height: '5\' 9"'$  (1.753 m)   Body mass index is 28.8 kg/m.     05/08/2022   12:18 PM 02/14/2020    2:41 PM 11/17/2018    8:17 AM 11/10/2017    2:17 PM 11/08/2015    7:50 AM 10/25/2015    8:51 AM  Advanced Directives  Does Patient Have a Medical Advance Directive? Yes Yes Yes Yes Yes Yes  Type of AParamedicof [place]Living will [place] will Healthcare Power of [place] will Healthcare Power of [place]Living will  Does patient want to make changes to medical advance directive?      No - Patient declined  Copy of HGlen Ferrisin Chart? No - copy requested No - copy requested  No -  copy requested  No - copy requested    Current Medications (verified) Outpatient Encounter Medications as of 05/08/2022  Medication Sig   fenofibrate 160 MG tablet Take 1 tablet by mouth once daily   FLUoxetine (PROZAC) 20 MG capsule Take 1 capsule by mouth once daily   losartan (COZAAR) 50 MG tablet Take 1 tablet by mouth once daily   Multiple Vitamins-Minerals (MULTIVITAMIN GUMMIES ADULTS PO) Take by mouth daily.   No facility-administered encounter medications on file as of 05/08/2022.    Allergies (verified) Oxycodone-acetaminophen and Penicillins   History: Past Medical History:  Diagnosis Date   Diverticulitis    HLD (hyperlipidemia)    HTN (hypertension)    Hx of pancreatitis    Personal history of colon cancer, stage II    Adenocarcinoma   Past Surgical History:  Procedure Laterality Date   COLONOSCOPY     HEMICOLECTOMY  07/2006   Family History  Problem Relation Age of Onset   Colon cancer Neg Hx    Esophageal cancer Neg Hx    Rectal cancer Neg Hx    Stomach cancer Neg Hx    Social History   Socioeconomic History   Marital status: Married    Spouse name: Not on file   Number of children: 2   Years of education: Not on file   Highest education level: Not on file  Occupational History   Occupation: Retired  Tobacco Use   Smoking status: Never   Smokeless tobacco: Never  Vaping  Use   Vaping Use: Never used  Substance and Sexual Activity   Alcohol use: Yes    Comment: beer occassionally   Drug use: No   Sexual activity: Not on file  Other Topics Concern   Not on file  Social History Narrative   J[employer]   Married   Social Determinants of Health   Financial Resource Strain: Low Risk  (05/08/2022)   Overall Financial Resource Strain (CARDIA)    Difficulty of Paying Living Expenses: Not hard at all  Food Insecurity: No Food Insecurity (05/08/2022)   Hunger Vital Sign    Worried About Running Out of Food in the Last Year: Never true     Ran Out of Food in the Last Year: Never true  Transportation Needs: No Transportation Needs (05/08/2022)   PRAPARE - Hydrologist (Medical): No    Lack of Transportation (Non-Medical): No  Physical Activity: Sufficiently Active (05/08/2022)   Exercise Vital Sign    Days of Exercise per Week: 7 days    Minutes of Exercise per Session: 30 min  Stress: No Stress Concern Present (05/08/2022)   [place]    Feeling of Stress : Only a little  Social Connections: Socially Integrated (05/08/2022)   Social Connection and Isolation Panel [NHANES]    Frequency of Communication with Friends and Family: More than three times a week    Frequency of Social Gatherings with Friends and Family: More than three times a week    Attends Religious Services: More than 4 times per year    Active Member of [employer] or Organizations: Yes    Attends Music therapist: More than 4 times per year    Marital Status: Married    Tobacco Counseling Counseling given: Not Answered   Clinical Intake:  Pre-visit preparation completed: Yes  Pain : No/denies pain     BMI - recorded: 28.8 Nutritional Status: BMI 25 -29 Overweight Nutritional Risks: None Diabetes: No  How often do you need to have someone help you when you read instructions, pamphlets, or other written materials from your doctor or pharmacy?: 1 - Never  Diabetic? no  Interpreter Needed?: No  Information entered by :: Randy Toya, LPN   Activities of Daily Living    05/08/2022   12:19 PM  In your present state of health, do you have any difficulty performing the following activities:  Hearing? 0  Vision? 0  Difficulty concentrating or making decisions? 0  Walking or climbing stairs? 0  Dressing or bathing? 0  Doing errands, shopping? 0  Preparing Food and eating ? N  Using the Toilet? N  In the past six months, have you accidently  leaked urine? N  Do you have problems with loss of bowel control? N  Managing your Medications? N  Managing your Finances? N  Housekeeping or managing your Housekeeping? N    Patient Care Team: Randy Loffler, MD as PCP - General  Indicate any recent Medical Services you may have received from other than [facility] providers in the past year (date may be approximate).     Assessment:   This is a routine wellness examination for Randy Boyle.  Hearing/Vision screen Hearing Screening - Comments:: Denies hearing difficulties   Vision Screening - Comments:: Wears rx glasses - behind with routine eye exams - no optometrist  Dietary issues and exercise activities discussed: Current Exercise Habits: Home exercise routine, Type of exercise: walking, Time (  Minutes): 30, Frequency (Times/Week): 7, Weekly Exercise (Minutes/Week): 210, Intensity: Mild, Exercise limited by: None identified   Goals Addressed             This Visit's Progress    Patient Stated   On track    05/08/2022 - I will continue to walk everyday for 30 minutes.  Continue fellowship and going to future religious meetings       Depression Screen    05/08/2022   12:15 PM 05/07/2021    8:38 AM 02/14/2020    2:43 PM 11/17/2018    8:17 AM 11/10/2017    2:18 PM 08/19/2016    8:48 AM 04/18/2014    8:29 AM  PHQ 2/9 Scores  PHQ - 2 Score 0 0 0 0 0 0 0  PHQ- 9 Score   0 0 0      Fall Risk    05/08/2022   12:14 PM 05/07/2021    8:38 AM 02/14/2020    2:42 PM 11/17/2018    8:17 AM 11/10/2017    2:18 PM  [place] in the past year? 0 0 0 0 No  Number falls in past yr: 0  0    Injury with Fall? 0  0    Risk for fall due to : No Fall Risks  Medication side effect    Follow up Falls prevention discussed  Falls evaluation completed;Falls prevention discussed      FALL RISK PREVENTION PERTAINING TO THE HOME:  Any stairs in or around the home? Yes  If so, are there any without handrails? No  Home free of loose throw rugs in  walkways, pet beds, electrical cords, etc? Yes  Adequate lighting in your home to reduce risk of falls? Yes   ASSISTIVE DEVICES UTILIZED TO PREVENT FALLS:  Life alert? No  Use of a cane, walker or w/c? No  Grab bars in the bathroom? No  Shower chair or bench in shower? No  Elevated toilet seat or a handicapped toilet? No   TIMED UP AND GO:  Was the test performed? No . Telephonic visit  Cognitive Function:    02/14/2020    2:44 PM 11/17/2018    8:17 AM 11/10/2017    2:15 PM  MMSE - Mini Mental State Exam  Orientation to time '5 5 5  '$ Orientation to Place '5 5 5  '$ Registration '3 3 3  '$ Attention/ Calculation 5 0 0  Recall '3 3 3  '$ Language- name 2 objects  0 0  Language- repeat '1 1 1  '$ Language- follow 3 step command  3 3  Language- read & follow direction  0 0  Write a sentence  0 0  Copy design  0 0  Total score  20 20        05/08/2022   12:19 PM  6CIT Screen  What Year? 0 points  What month? 0 points  What time? 0 points  Count back from 20 0 points  Months in reverse 0 points  Repeat phrase 2 points  Total Score 2 points    Immunizations Immunization History  Administered Date(s) Administered   PFIZER(Purple Top)SARS-COV-2 Vaccination 03/30/2020, 04/20/2020, 11/04/2020, 03/21/2022   Td 03/09/2008    TDAP status: Due, Education has been provided regarding the importance of this vaccine. Advised may receive this vaccine at local pharmacy or Health Dept. Aware to provide a copy of the vaccination record if obtained from local pharmacy or Health Dept. Verbalized acceptance and understanding.  Flu Vaccine status: Declined, Education has been provided regarding the importance of this vaccine but patient still declined. Advised may receive this vaccine at local pharmacy or Health Dept. Aware to provide a copy of the vaccination record if obtained from local pharmacy or Health Dept. Verbalized acceptance and understanding.  Pneumococcal vaccine status: Declined,  Education  has been provided regarding the importance of this vaccine but patient still declined. Advised may receive this vaccine at local pharmacy or Health Dept. Aware to provide a copy of the vaccination record if obtained from local pharmacy or Health Dept. Verbalized acceptance and understanding.   Covid-19 vaccine status: Completed vaccines  Qualifies for Shingles Vaccine? Yes   Zostavax completed No   Shingrix Completed?: No.    Education has been provided regarding the importance of this vaccine. Patient has been advised to call insurance company to determine out of pocket expense if they have not yet received this vaccine. Advised may also receive vaccine at local pharmacy or Health Dept. Verbalized acceptance and understanding.  Screening Tests Health Maintenance  Topic Date Due   COLONOSCOPY (Pts 45-10yr Insurance coverage will need to be confirmed)  11/07/2020   INFLUENZA VACCINE  04/21/2022   Zoster Vaccines- Shingrix (1 of 2) 08/08/2022 (Originally 04/19/1970)   Pneumonia Vaccine 73 Years old (1 - PCV) 05/09/2023 (Originally 04/19/2014)   TETANUS/TDAP  05/09/2023 (Originally 03/09/2018)   COVID-19 Vaccine (5 - Pfizer risk series) 05/16/2022   Hepatitis C Screening  Completed   HPV VACCINES  Aged Out    Health Maintenance  Health Maintenance Due  Topic Date Due   COLONOSCOPY (Pts 45-425yrInsurance coverage will need to be confirmed)  11/07/2020   INFLUENZA VACCINE  04/21/2022    Colorectal cancer screening: Type of screening: Colonoscopy. Completed 11/08/2015. Repeat every 5 years  Lung Cancer Screening: (Low Dose CT Chest recommended if Age 73-80ears, 30 pack-year currently smoking OR have quit w/in 15years.) does not qualify.   Additional Screening:  Hepatitis C Screening: does qualify; Completed 11/10/2017  Vision Screening: Recommended annual ophthalmology exams for early detection of glaucoma and other disorders of the eye. Is the patient up to date with their annual eye  exam?  No  Who is the provider or what is the name of the office in which the patient attends annual eye exams? none If pt is not established with a provider, would they like to be referred to a provider to establish care? No .   Dental Screening: Recommended annual dental exams for proper oral hygiene  Community Resource Referral / Chronic Care Management: CRR required this visit?  No   CCM required this visit?  No      Plan:     I have personally reviewed and noted the following in the patient's chart:   Medical and social history Use of alcohol, tobacco or illicit drugs  Current medications and supplements including opioid prescriptions. Patient is not currently taking opioid prescriptions. Functional ability and status Nutritional status Physical activity Advanced directives List of other physicians Hospitalizations, surgeries, and ER visits in previous 12 months Vitals Screenings to include cognitive, depression, and falls Referrals and appointments  In addition, I have reviewed and discussed with patient certain preventive protocols, quality metrics, and best practice recommendations. A written personalized care plan for preventive services as well as general preventive health recommendations were provided to patient.     AmSandrea HammondLPN   04/22/96/9892 Nurse Notes: None

## 2022-05-10 NOTE — Progress Notes (Unsigned)
Brealynn Contino T. Kirat Mezquita, MD, [place] at [facility] [place], [address]  Phone: [phone]  FAX: [phone]  Randy Boyle - 71 y.o. male  MRN [id number]  Date of Birth: 04/11/1951  Date: 05/11/2022  PCP: Owens Loffler, MD  Referral: Owens Loffler, MD  No chief complaint on file.  Patient Care Team: Owens Loffler, MD as PCP - General Subjective:   Randy Boyle is a 71 y.o. pleasant patient who presents with the following:  Preventative Health Maintenance Visit:  Health Maintenance Summary Reviewed and updated, unless pt declines services.  Tobacco History Reviewed. Alcohol: No concerns, no excessive use Exercise Habits: Some activity, rec at least 30 mins 5 times a week STD concerns: no risk or activity to increase risk Drug Use: None  Medicare wellness exam done with LPN.  Check all labs  Health Maintenance  Topic Date Due   COLONOSCOPY (Pts 45-60yr Insurance coverage will need to be confirmed)  11/07/2020   INFLUENZA VACCINE  04/21/2022   Zoster Vaccines- Shingrix (1 of 2) 08/08/2022 (Originally 04/19/1970)   Pneumonia Vaccine 73 Years old (1 - PCV) 05/09/2023 (Originally 04/19/2014)   TETANUS/TDAP  05/09/2023 (Originally 03/09/2018)   COVID-19 Vaccine (5 - Pfizer risk series) 05/16/2022   Hepatitis C Screening  Completed   HPV VACCINES  Aged Out   Immunization History  Administered Date(s) Administered   PFIZER(Purple Top)SARS-COV-2 Vaccination 03/30/2020, 04/20/2020, 11/04/2020, 03/21/2022   Td 03/09/2008   Patient Active Problem List   Diagnosis Date Noted   Personal history of colon cancer, stage II     Priority: High   Hyperlipidemia LDL goal <70 01/01/2009    Priority: Medium    Essential hypertension 01/01/2009    Priority: Medium    DIVERTICULITIS, HX OF 01/01/2009    Past Medical History:  Diagnosis Date   Diverticulitis    HLD (hyperlipidemia)    HTN (hypertension)    Hx  of pancreatitis    Personal history of colon cancer, stage II    Adenocarcinoma    Past Surgical History:  Procedure Laterality Date   COLONOSCOPY     HEMICOLECTOMY  07/2006    Family History  Problem Relation Age of Onset   Colon cancer Neg Hx    Esophageal cancer Neg Hx    Rectal cancer Neg Hx    Stomach cancer Neg Hx     Social History   Social History Narrative   Jehovah's Witness   Married    Past Medical History, Surgical History, Social History, Family History, Problem List, Medications, and Allergies have been reviewed and updated if relevant.  Review of Systems: Pertinent positives are listed above.  Otherwise, a full 14 point review of systems has been done in full and it is negative except where it is noted positive.  Objective:   There were no vitals taken for this visit. Ideal Body Weight:    Ideal Body Weight:   No results found.    05/08/2022   12:15 PM 05/07/2021    8:38 AM 02/14/2020    2:43 PM 11/17/2018    8:17 AM 11/10/2017    2:18 PM  Depression screen PHQ 2/9  Decreased Interest 0 0 0 0 0  Down, Depressed, Hopeless 0 0 0 0 0  PHQ - 2 Score 0 0 0 0 0  Altered sleeping   0 0 0  Tired, decreased energy   0 0 0  Change in appetite  0 0 0  Feeling bad or failure about yourself    0 0 0  Trouble concentrating   0 0 0  Moving slowly or fidgety/restless   0 0 0  Suicidal thoughts   0 0 0  PHQ-9 Score   0 0 0  Difficult doing work/chores   Not difficult at all Not difficult at all Not difficult at all     GEN: well developed, well nourished, no acute distress Eyes: conjunctiva and lids normal, PERRLA, EOMI ENT: TM clear, nares clear, oral exam WNL Neck: supple, no lymphadenopathy, no thyromegaly, no JVD Pulm: clear to auscultation and percussion, respiratory effort normal CV: regular rate and rhythm, S1-S2, no murmur, rub or gallop, no bruits, peripheral pulses normal and symmetric, no cyanosis, clubbing, edema or varicosities GI: soft,  non-tender; no hepatosplenomegaly, masses; active bowel sounds all quadrants GU: deferred Lymph: no cervical, axillary or inguinal adenopathy MSK: gait normal, muscle tone and strength WNL, no joint swelling, effusions, discoloration, crepitus  SKIN: clear, good turgor, color WNL, no rashes, lesions, or ulcerations Neuro: normal mental status, normal strength, sensation, and motion Psych: alert; oriented to person, place and time, normally interactive and not anxious or depressed in appearance.  All labs reviewed with patient. Results for orders placed or performed in visit on 04/30/21  Lipid panel  Result Value Ref Range   Cholesterol 200 0 - 200 mg/dL   Triglycerides 181.0 (H) 0.0 - 149.0 mg/dL   HDL 52.20 >39.00 mg/dL   VLDL 36.2 0.0 - 40.0 mg/dL   LDL Cholesterol 112 (H) 0 - 99 mg/dL   Total CHOL/HDL Ratio 4    NonHDL 149.70   Basic metabolic panel  Result Value Ref Range   Sodium 138 135 - 145 mEq/L   Potassium 4.1 3.5 - 5.1 mEq/L   Chloride 102 96 - 112 mEq/L   CO2 24 19 - 32 mEq/L   Glucose, Bld 95 70 - 99 mg/dL   BUN 14 6 - 23 mg/dL   Creatinine, Ser 1.21 0.40 - 1.50 mg/dL   GFR 60.02 >60.00 mL/min   Calcium 10.0 8.4 - 10.5 mg/dL  CBC with Differential/Platelet  Result Value Ref Range   WBC 6.9 4.0 - 10.5 K/uL   RBC 5.30 4.22 - 5.81 Mil/uL   Hemoglobin 15.1 13.0 - 17.0 g/dL   HCT 45.7 39.0 - 52.0 %   MCV 86.3 78.0 - 100.0 fl   MCHC 33.1 30.0 - 36.0 g/dL   RDW 14.0 11.5 - 15.5 %   Platelets 271.0 150.0 - 400.0 K/uL   Neutrophils Relative % 71.3 43.0 - 77.0 %   Lymphocytes Relative 16.9 12.0 - 46.0 %   Monocytes Relative 8.7 3.0 - 12.0 %   Eosinophils Relative 2.1 0.0 - 5.0 %   Basophils Relative 1.0 0.0 - 3.0 %   Neutro Abs 4.9 1.4 - 7.7 K/uL   Lymphs Abs 1.2 0.7 - 4.0 K/uL   Monocytes Absolute 0.6 0.1 - 1.0 K/uL   Eosinophils Absolute 0.1 0.0 - 0.7 K/uL   Basophils Absolute 0.1 0.0 - 0.1 K/uL  Hemoglobin A1c  Result Value Ref Range   Hgb A1c MFr Bld 6.2 4.6  - 6.5 %  PSA, Medicare  Result Value Ref Range   PSA 3.44 0.10 - 4.00 ng/ml    Assessment and Plan:     ICD-10-CM   1. Healthcare maintenance  Z00.00       Health Maintenance Exam: The patient's preventative maintenance and recommended  screening tests for an annual wellness exam were reviewed in full today. Brought up to date unless services declined.  Counselled on the importance of diet, exercise, and its role in overall health and mortality. The patient's FH and SH was reviewed, including their home life, tobacco status, and drug and alcohol status.  Follow-up in 1 year for physical exam or additional follow-up below.  Follow-up: No follow-ups on file. Or follow-up in 1 year if not noted.  No orders of the defined types were placed in this encounter.  There are no discontinued medications. No orders of the defined types were placed in this encounter.   Signed,  Maud Deed. Haizley Cannella, MD   Allergies as of 05/11/2022       Reactions   Oxycodone-acetaminophen    REACTION: hallucinations   Penicillins    REACTION: reacted as a child        Medication List        Accurate as of May 10, 2022 10:29 AM. If you have any questions, ask your nurse or doctor.          fenofibrate 160 MG tablet Take 1 tablet by mouth once daily   FLUoxetine 20 MG capsule Commonly known as: PROZAC Take 1 capsule by mouth once daily   losartan 50 MG tablet Commonly known as: COZAAR Take 1 tablet by mouth once daily   MULTIVITAMIN GUMMIES ADULTS PO Take by mouth daily.

## 2022-05-11 ENCOUNTER — Other Ambulatory Visit: Payer: Self-pay | Admitting: Family Medicine

## 2022-05-11 ENCOUNTER — Ambulatory Visit (INDEPENDENT_AMBULATORY_CARE_PROVIDER_SITE_OTHER): Payer: PPO | Admitting: Family Medicine

## 2022-05-11 ENCOUNTER — Encounter: Payer: Self-pay | Admitting: Family Medicine

## 2022-05-11 VITALS — BP 132/74 | HR 74 | Temp 98.3°F | Ht 69.0 in | Wt 201.0 lb

## 2022-05-11 DIAGNOSIS — Z85038 Personal history of other malignant neoplasm of large intestine: Secondary | ICD-10-CM

## 2022-05-11 DIAGNOSIS — Z79899 Other long term (current) drug therapy: Secondary | ICD-10-CM | POA: Diagnosis not present

## 2022-05-11 DIAGNOSIS — Z125 Encounter for screening for malignant neoplasm of prostate: Secondary | ICD-10-CM | POA: Diagnosis not present

## 2022-05-11 DIAGNOSIS — Z1211 Encounter for screening for malignant neoplasm of colon: Secondary | ICD-10-CM | POA: Diagnosis not present

## 2022-05-11 DIAGNOSIS — E785 Hyperlipidemia, unspecified: Secondary | ICD-10-CM | POA: Diagnosis not present

## 2022-05-11 DIAGNOSIS — R7303 Prediabetes: Secondary | ICD-10-CM | POA: Diagnosis not present

## 2022-05-11 DIAGNOSIS — Z0000 Encounter for general adult medical examination without abnormal findings: Secondary | ICD-10-CM

## 2022-05-11 LAB — LIPID PANEL
Cholesterol: 194 mg/dL (ref 0–200)
HDL: 53.4 mg/dL (ref >39.00)
NonHDL: 140.96
Total CHOL/HDL Ratio: 4
Triglycerides: 242 mg/dL — ABNORMAL HIGH (ref 0.0–149.0)
VLDL: 48.4 mg/dL — ABNORMAL HIGH (ref 0.0–40.0)

## 2022-05-11 LAB — HEPATIC FUNCTION PANEL
ALT: 24 U/L (ref 0–53)
AST: 25 U/L (ref 0–37)
Albumin: 5 g/dL (ref 3.5–5.2)
Alkaline Phosphatase: 45 U/L (ref 39–117)
Bilirubin, Direct: 0.1 mg/dL (ref 0.0–0.3)
Total Bilirubin: 0.5 mg/dL (ref 0.2–1.2)
Total Protein: 7.5 g/dL (ref 6.0–8.3)

## 2022-05-11 LAB — CBC WITH DIFFERENTIAL/PLATELET
Basophils Absolute: 0.1 10*3/uL (ref 0.0–0.1)
Basophils Relative: 0.9 % (ref 0.0–3.0)
Eosinophils Absolute: 0.2 10*3/uL (ref 0.0–0.7)
Eosinophils Relative: 2.8 % (ref 0.0–5.0)
HCT: 45.4 % (ref 39.0–52.0)
Hemoglobin: 14.9 g/dL (ref 13.0–17.0)
Lymphocytes Relative: 16.8 % (ref 12.0–46.0)
Lymphs Abs: 1.1 10*3/uL (ref 0.7–4.0)
MCHC: 32.8 g/dL (ref 30.0–36.0)
MCV: 86 fl (ref 78.0–100.0)
Monocytes Absolute: 0.6 10*3/uL (ref 0.1–1.0)
Monocytes Relative: 9.4 % (ref 3.0–12.0)
Neutro Abs: 4.7 10*3/uL (ref 1.4–7.7)
Neutrophils Relative %: 70.1 % (ref 43.0–77.0)
Platelets: 287 10*3/uL (ref 150.0–400.0)
RBC: 5.27 Mil/uL (ref 4.22–5.81)
RDW: 14.2 % (ref 11.5–15.5)
WBC: 6.7 10*3/uL (ref 4.0–10.5)

## 2022-05-11 LAB — BASIC METABOLIC PANEL
BUN: 15 mg/dL (ref 6–23)
CO2: 26 mEq/L (ref 19–32)
Calcium: 10.2 mg/dL (ref 8.4–10.5)
Chloride: 101 mEq/L (ref 96–112)
Creatinine, Ser: 1.24 mg/dL (ref 0.40–1.50)
GFR: 57.86 mL/min — ABNORMAL LOW (ref >60.00)
Glucose, Bld: 94 mg/dL (ref 70–99)
Potassium: 4.4 mEq/L (ref 3.5–5.1)
Sodium: 137 mEq/L (ref 135–145)

## 2022-05-11 LAB — HEMOGLOBIN A1C: Hgb A1c MFr Bld: 6.2 % (ref 4.6–6.5)

## 2022-05-11 LAB — LDL CHOLESTEROL, DIRECT: Direct LDL: 112 mg/dL

## 2022-05-11 LAB — PSA, MEDICARE: PSA: 4.75 ng/ml — ABNORMAL HIGH (ref 0.10–4.00)

## 2022-05-11 MED ORDER — FENOFIBRATE 160 MG PO TABS: 160.0000 mg | ORAL_TABLET | Freq: Every day | ORAL | 3 refills | Status: DC

## 2022-05-11 MED ORDER — FLUOXETINE HCL 20 MG PO CAPS
20.0000 mg | ORAL_CAPSULE | Freq: Every day | ORAL | 3 refills | Status: DC
Start: 2022-05-11 — End: 2023-06-28

## 2022-05-11 MED ORDER — LOSARTAN POTASSIUM 50 MG PO TABS
50.0000 mg | ORAL_TABLET | Freq: Every day | ORAL | 3 refills | Status: DC
Start: 2022-05-11 — End: 2023-08-02

## 2022-05-14 NOTE — Progress Notes (Signed)
Ultimately, this is his decision.

## 2023-05-10 ENCOUNTER — Ambulatory Visit (INDEPENDENT_AMBULATORY_CARE_PROVIDER_SITE_OTHER): Payer: PPO

## 2023-05-10 VITALS — Ht 69.0 in | Wt 194.0 lb

## 2023-05-10 DIAGNOSIS — Z0000 Encounter for general adult medical examination without abnormal findings: Secondary | ICD-10-CM | POA: Diagnosis not present

## 2023-05-10 NOTE — Progress Notes (Signed)
Subjective:   Randy Boyle is a 72 y.o. male who presents for Medicare Annual/Subsequent preventive examination.  Visit Complete: Virtual  I connected with  Randy Boyle on 05/10/23 by a audio enabled telemedicine application and verified that I am speaking with the correct person using two identifiers.  Patient Location: Home  Provider Location: Office/Clinic  I discussed the limitations of evaluation and management by telemedicine. The patient expressed understanding and agreed to proceed.  Vital Signs: Because this visit was a virtual/telehealth visit, some criteria may be missing or patient reported. Any vitals not documented were not able to be obtained and vitals that have been documented are patient reported.    Review of Systems       Cardiac Risk Factors include: advanced age (>56men, >3 women);hypertension;male gender;dyslipidemia;sedentary lifestyle     Objective:    Today's Vitals   05/10/23 1108  Weight: 194 lb (88 kg)  Height: 5\' 9"  (1.753 m)   Body mass index is 28.65 kg/m.     05/10/2023   11:17 AM 05/08/2022   12:18 PM 02/14/2020    2:41 PM 11/17/2018    8:17 AM 11/10/2017    2:17 PM 11/08/2015    7:50 AM 10/25/2015    8:51 AM  Advanced Directives  Does Patient Have a Medical Advance Directive? Yes Yes Yes Yes Yes Yes Yes  Type of Estate agent of [place];Living will Healthcare Power of [place];Living will Healthcare Power of [place];Living will Healthcare Power of [employer] of [place];Living will Healthcare Power of [employer] of [place];Living will  Does patient want to make changes to medical advance directive?       No - Patient declined  Copy of Healthcare Power of Attorney in Chart? No - copy requested No - copy requested No - copy requested -- No - copy requested  No - copy requested    Current Medications (verified) Outpatient Encounter Medications as of 05/10/2023  Medication Sig   fenofibrate  160 MG tablet Take 1 tablet (160 mg total) by mouth daily.   FLUoxetine (PROZAC) 20 MG capsule Take 1 capsule (20 mg total) by mouth daily.   losartan (COZAAR) 50 MG tablet Take 1 tablet (50 mg total) by mouth daily.   Multiple Vitamins-Minerals (MULTIVITAMIN GUMMIES ADULTS PO) Take by mouth daily.   No facility-administered encounter medications on file as of 05/10/2023.    Allergies (verified) Oxycodone-acetaminophen and Penicillins   History: Past Medical History:  Diagnosis Date   Diverticulitis    HLD (hyperlipidemia)    HTN (hypertension)    Hx of pancreatitis    Personal history of colon cancer, stage II    Adenocarcinoma   Past Surgical History:  Procedure Laterality Date   COLONOSCOPY     HEMICOLECTOMY  07/2006   Family History  Problem Relation Age of Onset   Colon cancer Neg Hx    Esophageal cancer Neg Hx    Rectal cancer Neg Hx    Stomach cancer Neg Hx    Social History   Socioeconomic History   Marital status: Married    Spouse name: Not on file   Number of children: 2   Years of education: Not on file   Highest education level: Not on file  Occupational History   Occupation: Retired  Tobacco Use   Smoking status: Never   Smokeless tobacco: Never  Vaping Use   Vaping status: Never Used  Substance and Sexual Activity   Alcohol use: Yes  Comment: beer occassionally   Drug use: No   Sexual activity: Not on file  Other Topics Concern   Not on file  Social History Narrative   Jehovah's Witness   Married   Social Determinants of Health   Financial Resource Strain: Low Risk  (05/10/2023)   Overall Financial Resource Strain (CARDIA)    Difficulty of Paying Living Expenses: Not hard at all  Food Insecurity: No Food Insecurity (05/10/2023)   Hunger Vital Sign    Worried About Running Out of Food in the Last Year: Never true    Ran Out of Food in the Last Year: Never true  Transportation Needs: No Transportation Needs (05/10/2023)   PRAPARE -  Administrator, Civil Service (Medical): No    Lack of Transportation (Non-Medical): No  Physical Activity: Insufficiently Active (05/10/2023)   Exercise Vital Sign    Days of Exercise per Week: 7 days    Minutes of Exercise per Session: 10 min  Stress: No Stress Concern Present (05/10/2023)   [employer] of Occupational Health - Occupational Stress Questionnaire    Feeling of Stress : Not at all  Social Connections: Socially Integrated (05/10/2023)   Social Connection and Isolation Panel [NHANES]    Frequency of Communication with Friends and Family: More than three times a week    Frequency of Social Gatherings with Friends and Family: More than three times a week    Attends Religious Services: More than 4 times per year    Active Member of [employer] or Organizations: Yes    Attends Engineer, structural: More than 4 times per year    Marital Status: Married    Tobacco Counseling Counseling given: Not Answered   Clinical Intake:  Pre-visit preparation completed: Yes  Pain : No/denies pain     BMI - recorded: 28.65 Nutritional Status: BMI 25 -29 Overweight Nutritional Risks: Nausea/ vomitting/ diarrhea (occasionally) Diabetes: No  How often do you need to have someone help you when you read instructions, pamphlets, or other written materials from your doctor or pharmacy?: 1 - Never  Interpreter Needed?: No  Information entered by :: C.Gertrude Tarbet LPN   Activities of Daily Living    05/10/2023   11:18 AM  In your present state of health, do you have any difficulty performing the following activities:  Hearing? 0  Vision? 0  Difficulty concentrating or making decisions? 1  Comment occasionally forgets  Walking or climbing stairs? 0  Dressing or bathing? 0  Doing errands, shopping? 0  Preparing Food and eating ? N  Using the Toilet? N  In the past six months, have you accidently leaked urine? N  Do you have problems with loss of bowel control? N   Managing your Medications? N  Managing your Finances? N  Housekeeping or managing your Housekeeping? N    Patient Care Team: Hannah Beat, MD as PCP - General  Indicate any recent Medical Services you may have received from other than [facility] providers in the past year (date may be approximate).     Assessment:   This is a routine wellness examination for [place].  Hearing/Vision screen Hearing Screening - Comments:: Denies hearing difficulties   Vision Screening - Comments:: Glasses - Not UTD with eye exams   Dietary issues and exercise activities discussed:     Goals Addressed             This Visit's Progress    Patient Stated  Stay active and lose weight.       Depression Screen    05/10/2023   11:12 AM 05/08/2022   12:15 PM 05/07/2021    8:38 AM 02/14/2020    2:43 PM 11/17/2018    8:17 AM 11/10/2017    2:18 PM 08/19/2016    8:48 AM  PHQ 2/9 Scores  PHQ - 2 Score 0 0 0 0 0 0 0  PHQ- 9 Score    0 0 0     Fall Risk    05/10/2023   11:15 AM 05/08/2022   12:14 PM 05/07/2021    8:38 AM 02/14/2020    2:42 PM 11/17/2018    8:17 AM  Fall Risk   Falls in the past year? 1 0 0 0 0  Number falls in past yr: 1 0  0   Comment tripped over object both falls      Injury with Fall? 0 0  0   Risk for fall due to : No Fall Risks No Fall Risks  Medication side effect   Follow up Falls prevention discussed;Falls evaluation completed Falls prevention discussed  Falls evaluation completed;Falls prevention discussed     MEDICARE RISK AT HOME: Medicare Risk at Home Any stairs in or around the home?: Yes If so, are there any without handrails?: No Home free of loose throw rugs in walkways, pet beds, electrical cords, etc?: Yes Adequate lighting in your home to reduce risk of falls?: Yes Life alert?: No Use of a cane, walker or w/c?: No Grab bars in the bathroom?: Yes Shower chair or bench in shower?: Yes Elevated toilet seat or a handicapped toilet?: No  TIMED UP AND  GO:  Was the test performed?  No    Cognitive Function:    02/14/2020    2:44 PM 11/17/2018    8:17 AM 11/10/2017    2:15 PM  MMSE - Mini Mental State Exam  Orientation to time 5 5 5   Orientation to Place 5 5 5   Registration 3 3 3   Attention/ Calculation 5 0 0  Recall 3 3 3   Language- name 2 objects  0 0  Language- repeat 1 1 1   Language- follow 3 step command  3 3  Language- read & follow direction  0 0  Write a sentence  0 0  Copy design  0 0  Total score  20 20        05/10/2023   11:19 AM 05/08/2022   12:19 PM  6CIT Screen  What Year? 0 points 0 points  What month? 0 points 0 points  What time? 0 points 0 points  Count back from 20 0 points 0 points  Months in reverse 0 points 0 points  Repeat phrase 0 points 2 points  Total Score 0 points 2 points    Immunizations Immunization History  Administered Date(s) Administered   PFIZER(Purple Top)SARS-COV-2 Vaccination 03/30/2020, 04/20/2020, 11/04/2020, 03/21/2022   Td 03/09/2008    TDAP status: Due, Education has been provided regarding the importance of this vaccine. Advised may receive this vaccine at local pharmacy or Health Dept. Aware to provide a copy of the vaccination record if obtained from local pharmacy or Health Dept. Verbalized acceptance and understanding.  Flu Vaccine status: Declined, Education has been provided regarding the importance of this vaccine but patient still declined. Advised may receive this vaccine at local pharmacy or Health Dept. Aware to provide a copy of the vaccination record if obtained from local pharmacy or  Health Dept. Verbalized acceptance and understanding.  Pneumococcal vaccine status: Declined,  Education has been provided regarding the importance of this vaccine but patient still declined. Advised may receive this vaccine at local pharmacy or Health Dept. Aware to provide a copy of the vaccination record if obtained from local pharmacy or Health Dept. Verbalized acceptance and  understanding.   Covid-19 vaccine status: Declined, Education has been provided regarding the importance of this vaccine but patient still declined. Advised may receive this vaccine at local pharmacy or Health Dept.or vaccine clinic. Aware to provide a copy of the vaccination record if obtained from local pharmacy or Health Dept. Verbalized acceptance and understanding.  Qualifies for Shingles Vaccine? Yes   Zostavax completed No   Shingrix Completed?: No.    Education has been provided regarding the importance of this vaccine. Patient has been advised to call insurance company to determine out of pocket expense if they have not yet received this vaccine. Advised may also receive vaccine at local pharmacy or Health Dept. Verbalized acceptance and understanding.  Screening Tests Health Maintenance  Topic Date Due   Zoster Vaccines- Shingrix (1 of 2) Never done   Pneumonia Vaccine 61+ Years old (1 of 1 - PCV) Never done   DTaP/Tdap/Td (2 - Tdap) 03/09/2018   Colonoscopy  11/07/2020   COVID-19 Vaccine (5 - 2023-24 season) 05/22/2022   INFLUENZA VACCINE  04/22/2023   Medicare Annual Wellness (AWV)  05/09/2024   Hepatitis C Screening  Completed   HPV VACCINES  Aged Out    Health Maintenance  Health Maintenance Due  Topic Date Due   Zoster Vaccines- Shingrix (1 of 2) Never done   Pneumonia Vaccine 70+ Years old (1 of 1 - PCV) Never done   DTaP/Tdap/Td (2 - Tdap) 03/09/2018   Colonoscopy  11/07/2020   COVID-19 Vaccine (5 - 2023-24 season) 05/22/2022   INFLUENZA VACCINE  04/22/2023    Colorectal cancer screening: Type of screening: Colonoscopy. Completed 11/08/15. Repeat every 5 years Pt declined referral.  Lung Cancer Screening: (Low Dose CT Chest recommended if Age 6-80 years, 20 pack-year currently smoking OR have quit w/in 15years.) does not qualify.   Lung Cancer Screening Referral:    Additional Screening:  Hepatitis C Screening: does qualify; Completed 11/10/17  Vision  Screening: Recommended annual ophthalmology exams for early detection of glaucoma and other disorders of the eye. Is the patient up to date with their annual eye exam?  No  Who is the provider or what is the name of the office in which the patient attends annual eye exams? None If pt is not established with a provider, would they like to be referred to a provider to establish care? Yes .   Dental Screening: Recommended annual dental exams for proper oral hygiene    Community Resource Referral / Chronic Care Management: CRR required this visit?  No   CCM required this visit?  No     Plan:     I have personally reviewed and noted the following in the patient's chart:   Medical and social history Use of alcohol, tobacco or illicit drugs  Current medications and supplements including opioid prescriptions. Patient is not currently taking opioid prescriptions. Functional ability and status Nutritional status Physical activity Advanced directives List of other physicians Hospitalizations, surgeries, and ER visits in previous 12 months Vitals Screenings to include cognitive, depression, and falls Referrals and appointments  In addition, I have reviewed and discussed with patient certain preventive protocols, quality metrics, and best  practice recommendations. A written personalized care plan for preventive services as well as general preventive health recommendations were provided to patient.     Maryan Puls, LPN   [id number]/84/[id number]   After Visit Summary: (MyChart) Due to this being a telephonic visit, the after visit summary with patients personalized plan was offered to patient via MyChart   Nurse Notes: Pt declined order for colonoscopy. Pt advised of need to schedule eye exam appointment , pt verbalized understanding.  Vaccinations: declines all Influenza vaccine: recommend every Fall Pneumococcal vaccine: recommend once per lifetime Prevnar-20 Tdap vaccine: recommend every  10 years Shingles vaccine: recommend Shingrix which is 2 doses 2-6 months apart and over 90% effective     Covid-19: recommend 2 doses one month apart with a booster 6 months later

## 2023-05-10 NOTE — Patient Instructions (Signed)
Mr. Randy Boyle , Thank you for taking time to come for your Medicare Wellness Visit. I appreciate your ongoing commitment to your health goals. Please review the following plan we discussed and let me know if I can assist you in the future.   Referrals/Orders/Follow-Ups/Clinician Recommendations: Aim for 30 minutes of exercise or brisk walking, 6-8 glasses of water, and 5 servings of fruits and vegetables each day.   This is a list of the screening recommended for you and due dates:  Health Maintenance  Topic Date Due   Zoster (Shingles) Vaccine (1 of 2) Never done   Pneumonia Vaccine (1 of 1 - PCV) Never done   DTaP/Tdap/Td vaccine (2 - Tdap) 03/09/2018   Colon Cancer Screening  11/07/2020   COVID-19 Vaccine (5 - 2023-24 season) 05/22/2022   Medicare Annual Wellness Visit  05/09/2023   Flu Shot  04/22/2023   Hepatitis C Screening  Completed   HPV Vaccine  Aged Out    Advanced directives: (Copy Requested) Please bring a copy of your health care power of attorney and living will to the office to be added to your chart at your convenience.  Next Medicare Annual Wellness Visit scheduled for next year: Yes  Preventive Care 33 Years and Older, Male  Preventive care refers to lifestyle choices and visits with your health care provider that can promote health and wellness. What does preventive care include? A yearly physical exam. This is also called an annual well check. Dental exams once or twice a year. Routine eye exams. Ask your health care provider how often you should have your eyes checked. Personal lifestyle choices, including: Daily care of your teeth and gums. Regular physical activity. Eating a healthy diet. Avoiding tobacco and drug use. Limiting alcohol use. Practicing safe sex. Taking low doses of aspirin every day. Taking vitamin and mineral supplements as recommended by your health care provider. What happens during an annual well check? The services and screenings done by  your health care provider during your annual well check will depend on your age, overall health, lifestyle risk factors, and family history of disease. Counseling  Your health care provider may ask you questions about your: Alcohol use. Tobacco use. Drug use. Emotional well-being. Home and relationship well-being. Sexual activity. Eating habits. History of falls. Memory and ability to understand (cognition). Work and work Astronomer. Screening  You may have the following tests or measurements: Height, weight, and BMI. Blood pressure. Lipid and cholesterol levels. These may be checked every 5 years, or more frequently if you are over 67 years old. Skin check. Lung cancer screening. You may have this screening every year starting at age 74 if you have a 30-pack-year history of smoking and currently smoke or have quit within the past 15 years. Fecal occult blood test (FOBT) of the stool. You may have this test every year starting at age 74. Flexible sigmoidoscopy or colonoscopy. You may have a sigmoidoscopy every 5 years or a colonoscopy every 10 years starting at age 74. Prostate cancer screening. Recommendations will vary depending on your family history and other risks. Hepatitis C blood test. Hepatitis B blood test. Sexually transmitted disease (STD) testing. Diabetes screening. This is done by checking your blood sugar (glucose) after you have not eaten for a while (fasting). You may have this done every 1-3 years. Abdominal aortic aneurysm (AAA) screening. You may need this if you are a current or former smoker. Osteoporosis. You may be screened starting at age 47 if you are  at high risk. Talk with your health care provider about your test results, treatment options, and if necessary, the need for more tests. Vaccines  Your health care provider may recommend certain vaccines, such as: Influenza vaccine. This is recommended every year. Tetanus, diphtheria, and acellular pertussis  (Tdap, Td) vaccine. You may need a Td booster every 10 years. Zoster vaccine. You may need this after age 47. Pneumococcal 13-valent conjugate (PCV13) vaccine. One dose is recommended after age 48. Pneumococcal polysaccharide (PPSV23) vaccine. One dose is recommended after age 60. Talk to your health care provider about which screenings and vaccines you need and how often you need them. This information is not intended to replace advice given to you by your health care provider. Make sure you discuss any questions you have with your health care provider. Document Released: 10/04/2015 Document Revised: 05/27/2016 Document Reviewed: 07/09/2015 Elsevier Interactive Patient Education  2017 [employer].  Fall Prevention in the Home Falls can cause injuries. They can happen to people of all ages. There are many things you can do to make your home safe and to help prevent falls. What can I do on the outside of my home? Regularly fix the edges of walkways and driveways and fix any cracks. Remove anything that might make you trip as you walk through a door, such as a raised step or threshold. Trim any bushes or trees on the path to your home. Use bright outdoor lighting. Clear any walking paths of anything that might make someone trip, such as rocks or tools. Regularly check to see if handrails are loose or broken. Make sure that both sides of any steps have handrails. Any raised decks and porches should have guardrails on the edges. Have any leaves, snow, or ice cleared regularly. Use sand or salt on walking paths during winter. Clean up any spills in your garage right away. This includes oil or grease spills. What can I do in the bathroom? Use night lights. Install grab bars by the toilet and in the tub and shower. Do not use towel bars as grab bars. Use non-skid mats or decals in the tub or shower. If you need to sit down in the shower, use a plastic, non-slip stool. Keep the floor dry. Clean  up any water that spills on the floor as soon as it happens. Remove soap buildup in the tub or shower regularly. Attach bath mats securely with double-sided non-slip rug tape. Do not have throw rugs and other things on the floor that can make you trip. What can I do in the bedroom? Use night lights. Make sure that you have a light by your bed that is easy to reach. Do not use any sheets or blankets that are too big for your bed. They should not hang down onto the floor. Have a firm chair that has side arms. You can use this for support while you get dressed. Do not have throw rugs and other things on the floor that can make you trip. What can I do in the kitchen? Clean up any spills right away. Avoid walking on wet floors. Keep items that you use a lot in easy-to-reach places. If you need to reach something above you, use a strong step stool that has a grab bar. Keep electrical cords out of the way. Do not use floor polish or wax that makes floors slippery. If you must use wax, use non-skid floor wax. Do not have throw rugs and other things on the floor  that can make you trip. What can I do with my stairs? Do not leave any items on the stairs. Make sure that there are handrails on both sides of the stairs and use them. Fix handrails that are broken or loose. Make sure that handrails are as long as the stairways. Check any carpeting to make sure that it is firmly attached to the stairs. Fix any carpet that is loose or worn. Avoid having throw rugs at the top or bottom of the stairs. If you do have throw rugs, attach them to the floor with carpet tape. Make sure that you have a light switch at the top of the stairs and the bottom of the stairs. If you do not have them, ask someone to add them for you. What else can I do to help prevent falls? Wear shoes that: Do not have high heels. Have rubber bottoms. Are comfortable and fit you well. Are closed at the toe. Do not wear sandals. If you  use a stepladder: Make sure that it is fully opened. Do not climb a closed stepladder. Make sure that both sides of the stepladder are locked into place. Ask someone to hold it for you, if possible. Clearly mark and make sure that you can see: Any grab bars or handrails. First and last steps. Where the edge of each step is. Use tools that help you move around (mobility aids) if they are needed. These include: Canes. Walkers. Scooters. Crutches. Turn on the lights when you go into a dark area. Replace any light bulbs as soon as they burn out. Set up your furniture so you have a clear path. Avoid moving your furniture around. If any of your floors are uneven, fix them. If there are any pets around you, be aware of where they are. Review your medicines with your doctor. Some medicines can make you feel dizzy. This can increase your chance of falling. Ask your doctor what other things that you can do to help prevent falls. This information is not intended to replace advice given to you by your health care provider. Make sure you discuss any questions you have with your health care provider. Document Released: 07/04/2009 Document Revised: 02/13/2016 Document Reviewed: 10/12/2014 Elsevier Interactive Patient Education  2017 [employer].

## 2023-06-28 ENCOUNTER — Other Ambulatory Visit: Payer: Self-pay | Admitting: Family Medicine

## 2023-06-28 NOTE — Telephone Encounter (Signed)
Patient has been scheduled

## 2023-06-28 NOTE — Telephone Encounter (Signed)
Patient has not been seen in one year. Refill for 30 days sent with message to call for appointment. Sending message to support pool to attempt to call patient as well.

## 2023-06-28 NOTE — Telephone Encounter (Signed)
LVM for patient to call back and schedule

## 2023-07-30 ENCOUNTER — Telehealth: Payer: Self-pay | Admitting: *Deleted

## 2023-07-30 DIAGNOSIS — R7303 Prediabetes: Secondary | ICD-10-CM

## 2023-07-30 DIAGNOSIS — E785 Hyperlipidemia, unspecified: Secondary | ICD-10-CM

## 2023-07-30 DIAGNOSIS — Z79899 Other long term (current) drug therapy: Secondary | ICD-10-CM

## 2023-07-30 DIAGNOSIS — Z125 Encounter for screening for malignant neoplasm of prostate: Secondary | ICD-10-CM

## 2023-07-30 NOTE — Telephone Encounter (Signed)
-----   Message from Vincenza Hews sent at 07/30/2023  1:22 PM EST ----- Regarding: Labs Mon 08/16/23 Hello,  Patient is coming in for CPE labs on Monday 08/16/23. Can we get orders please.   Thanks

## 2023-07-31 ENCOUNTER — Other Ambulatory Visit: Payer: Self-pay | Admitting: Family Medicine

## 2023-08-02 NOTE — Telephone Encounter (Signed)
Patient called in to follow up on refill request. Thank you!

## 2023-08-04 ENCOUNTER — Other Ambulatory Visit: Payer: Self-pay | Admitting: Family Medicine

## 2023-08-16 ENCOUNTER — Other Ambulatory Visit (INDEPENDENT_AMBULATORY_CARE_PROVIDER_SITE_OTHER): Payer: PPO

## 2023-08-16 DIAGNOSIS — Z125 Encounter for screening for malignant neoplasm of prostate: Secondary | ICD-10-CM

## 2023-08-16 DIAGNOSIS — E785 Hyperlipidemia, unspecified: Secondary | ICD-10-CM | POA: Diagnosis not present

## 2023-08-16 DIAGNOSIS — R7303 Prediabetes: Secondary | ICD-10-CM | POA: Diagnosis not present

## 2023-08-16 DIAGNOSIS — Z79899 Other long term (current) drug therapy: Secondary | ICD-10-CM | POA: Diagnosis not present

## 2023-08-17 LAB — BASIC METABOLIC PANEL
BUN: 18 mg/dL (ref 6–23)
CO2: 27 meq/L (ref 19–32)
Calcium: 10.1 mg/dL (ref 8.4–10.5)
Chloride: 103 meq/L (ref 96–112)
Creatinine, Ser: 1.25 mg/dL (ref 0.40–1.50)
GFR: 56.8 mL/min — ABNORMAL LOW (ref >60.00)
Glucose, Bld: 100 mg/dL — ABNORMAL HIGH (ref 70–99)
Potassium: 4.3 meq/L (ref 3.5–5.1)
Sodium: 139 meq/L (ref 135–145)

## 2023-08-17 LAB — LIPID PANEL
Cholesterol: 196 mg/dL (ref 0–200)
HDL: 49.6 mg/dL (ref >39.00)
LDL Cholesterol: 103 mg/dL — ABNORMAL HIGH (ref 0–99)
NonHDL: 146.44
Total CHOL/HDL Ratio: 4
Triglycerides: 215 mg/dL — ABNORMAL HIGH (ref 0.0–149.0)
VLDL: 43 mg/dL — ABNORMAL HIGH (ref 0.0–40.0)

## 2023-08-17 LAB — HEPATIC FUNCTION PANEL
ALT: 24 U/L (ref 0–53)
AST: 25 U/L (ref 0–37)
Albumin: 4.8 g/dL (ref 3.5–5.2)
Alkaline Phosphatase: 43 U/L (ref 39–117)
Bilirubin, Direct: 0.1 mg/dL (ref 0.0–0.3)
Total Bilirubin: 0.4 mg/dL (ref 0.2–1.2)
Total Protein: 7.3 g/dL (ref 6.0–8.3)

## 2023-08-17 LAB — CBC WITH DIFFERENTIAL/PLATELET
Basophils Absolute: 0.1 10*3/uL (ref 0.0–0.1)
Basophils Relative: 1.1 % (ref 0.0–3.0)
Eosinophils Absolute: 0.2 10*3/uL (ref 0.0–0.7)
Eosinophils Relative: 4.4 % (ref 0.0–5.0)
HCT: 43.7 % (ref 39.0–52.0)
Hemoglobin: 14.4 g/dL (ref 13.0–17.0)
Lymphocytes Relative: 24.5 % (ref 12.0–46.0)
Lymphs Abs: 1.3 10*3/uL (ref 0.7–4.0)
MCHC: 32.8 g/dL (ref 30.0–36.0)
MCV: 86.6 fL (ref 78.0–100.0)
Monocytes Absolute: 0.8 10*3/uL (ref 0.1–1.0)
Monocytes Relative: 15 % — ABNORMAL HIGH (ref 3.0–12.0)
Neutro Abs: 2.9 10*3/uL (ref 1.4–7.7)
Neutrophils Relative %: 55 % (ref 43.0–77.0)
Platelets: 267 10*3/uL (ref 150.0–400.0)
RBC: 5.05 Mil/uL (ref 4.22–5.81)
RDW: 14.4 % (ref 11.5–15.5)
WBC: 5.2 10*3/uL (ref 4.0–10.5)

## 2023-08-17 LAB — PSA, MEDICARE: PSA: 6 ng/mL — ABNORMAL HIGH (ref 0.10–4.00)

## 2023-08-17 LAB — HEMOGLOBIN A1C: Hgb A1c MFr Bld: 6.3 % (ref 4.6–6.5)

## 2023-08-22 NOTE — Progress Notes (Unsigned)
Janyah Singleterry T. Vearl Aitken, MD, CAQ Sports Medicine [facility] [address] [place], [address]  Phone: [phone]  FAX: [phone]  Randy Boyle - 73 y.o. male  MRN [id number]  Date of Birth: 01/04/1951  Date: 08/23/2023  PCP: Hannah Beat, MD  Referral: Hannah Beat, MD  No chief complaint on file.  Patient Care Team: Hannah Beat, MD as PCP - General Subjective:   Randy Boyle is a 72 y.o. pleasant patient who presents with the following:  Preventative Health Maintenance Visit:  Health Maintenance Summary Reviewed and updated, unless pt declines services.  Tobacco History Reviewed. Alcohol: No concerns, no excessive use Exercise Habits: Some activity, rec at least 30 mins 5 times a week STD concerns: no risk or activity to increase risk Drug Use: None  Randy Boyle presents in follow-up after his Medicare wellness exam done by LPN.  - Normally, he does not get vaccinations Shingrix Prevnar-20 Tdap Colonoscopy Flu  Health Maintenance  Topic Date Due   Zoster Vaccines- Shingrix (1 of 2) Never done   Pneumonia Vaccine 48+ Years old (1 of 1 - PCV) Never done   DTaP/Tdap/Td (2 - Tdap) 03/09/2018   Colonoscopy  11/07/2020   INFLUENZA VACCINE  Never done   COVID-19 Vaccine (5 - 2023-24 season) 05/23/2023   Medicare Annual Wellness (AWV)  05/09/2024   Hepatitis C Screening  Completed   HPV VACCINES  Aged Out   Immunization History  Administered Date(s) Administered   PFIZER(Purple Top)SARS-COV-2 Vaccination 03/30/2020, 04/20/2020, 11/04/2020, 03/21/2022   Td 03/09/2008   Patient Active Problem List   Diagnosis Date Noted   Personal history of colon cancer, stage II     Priority: High   Hyperlipidemia LDL goal <70 01/01/2009    Priority: Medium    Essential hypertension 01/01/2009    Priority: Medium    DIVERTICULITIS, HX OF 01/01/2009    Past Medical History:  Diagnosis Date   Diverticulitis    HLD  (hyperlipidemia)    HTN (hypertension)    Hx of pancreatitis    Personal history of colon cancer, stage II    Adenocarcinoma    Past Surgical History:  Procedure Laterality Date   COLONOSCOPY     HEMICOLECTOMY  07/2006    Family History  Problem Relation Age of Onset   Colon cancer Neg Hx    Esophageal cancer Neg Hx    Rectal cancer Neg Hx    Stomach cancer Neg Hx     Social History   Social History Narrative   Jehovah's Witness   Married    Past Medical History, Surgical History, Social History, Family History, Problem List, Medications, and Allergies have been reviewed and updated if relevant.  Review of Systems: Pertinent positives are listed above.  Otherwise, a full 14 point review of systems has been done in full and it is negative except where it is noted positive.  Objective:   There were no vitals taken for this visit. Ideal Body Weight:    Ideal Body Weight:   No results found.    05/10/2023   11:12 AM 05/08/2022   12:15 PM 05/07/2021    8:38 AM 02/14/2020    2:43 PM 11/17/2018    8:17 AM  Depression screen PHQ 2/9  Decreased Interest 0 0 0 0 0  Down, Depressed, Hopeless 0 0 0 0 0  PHQ - 2 Score 0 0 0 0 0  Altered sleeping    0 0  Tired, decreased energy    0 0  Change in appetite    0 0  Feeling bad or failure about yourself     0 0  Trouble concentrating    0 0  Moving slowly or fidgety/restless    0 0  Suicidal thoughts    0 0  PHQ-9 Score    0 0  Difficult doing work/chores    Not difficult at all Not difficult at all     GEN: well developed, well nourished, no acute distress Eyes: conjunctiva and lids normal, PERRLA, EOMI ENT: TM clear, nares clear, oral exam WNL Neck: supple, no lymphadenopathy, no thyromegaly, no JVD Pulm: clear to auscultation and percussion, respiratory effort normal CV: regular rate and rhythm, S1-S2, no murmur, rub or gallop, no bruits, peripheral pulses normal and symmetric, no cyanosis, clubbing, edema or  varicosities GI: soft, non-tender; no hepatosplenomegaly, masses; active bowel sounds all quadrants GU: deferred Lymph: no cervical, axillary or inguinal adenopathy MSK: gait normal, muscle tone and strength WNL, no joint swelling, effusions, discoloration, crepitus  SKIN: clear, good turgor, color WNL, no rashes, lesions, or ulcerations Neuro: normal mental status, normal strength, sensation, and motion Psych: alert; oriented to person, place and time, normally interactive and not anxious or depressed in appearance.  All labs reviewed with patient. Results for orders placed or performed in visit on 08/16/23  Hepatic function panel  Result Value Ref Range   Total Bilirubin 0.4 0.2 - 1.2 mg/dL   Bilirubin, Direct 0.1 0.0 - 0.3 mg/dL   Alkaline Phosphatase 43 39 - 117 U/L   AST 25 0 - 37 U/L   ALT 24 0 - 53 U/L   Total Protein 7.3 6.0 - 8.3 g/dL   Albumin 4.8 3.5 - 5.2 g/dL  Basic metabolic panel  Result Value Ref Range   Sodium 139 135 - 145 mEq/L   Potassium 4.3 3.5 - 5.1 mEq/L   Chloride 103 96 - 112 mEq/L   CO2 27 19 - 32 mEq/L   Glucose, Bld 100 (H) 70 - 99 mg/dL   BUN 18 6 - 23 mg/dL   Creatinine, Ser 0.98 0.40 - 1.50 mg/dL   GFR 11.91 (L) >47.82 mL/min   Calcium 10.1 8.4 - 10.5 mg/dL  PSA, Medicare  Result Value Ref Range   PSA 6.00 (H) 0.10 - 4.00 ng/ml  Hemoglobin A1c  Result Value Ref Range   Hgb A1c MFr Bld 6.3 4.6 - 6.5 %  CBC with Differential/Platelet  Result Value Ref Range   WBC 5.2 4.0 - 10.5 K/uL   RBC 5.05 4.22 - 5.81 Mil/uL   Hemoglobin 14.4 13.0 - 17.0 g/dL   HCT 95.6 21.3 - 08.6 %   MCV 86.6 78.0 - 100.0 fl   MCHC 32.8 30.0 - 36.0 g/dL   RDW 57.8 46.9 - 62.9 %   Platelets 267.0 150.0 - 400.0 K/uL   Neutrophils Relative % 55.0 43.0 - 77.0 %   Lymphocytes Relative 24.5 12.0 - 46.0 %   Monocytes Relative 15.0 (H) 3.0 - 12.0 %   Eosinophils Relative 4.4 0.0 - 5.0 %   Basophils Relative 1.1 0.0 - 3.0 %   Neutro Abs 2.9 1.4 - 7.7 K/uL   Lymphs Abs  1.3 0.7 - 4.0 K/uL   Monocytes Absolute 0.8 0.1 - 1.0 K/uL   Eosinophils Absolute 0.2 0.0 - 0.7 K/uL   Basophils Absolute 0.1 0.0 - 0.1 K/uL  Lipid panel  Result Value Ref Range  Cholesterol 196 0 - 200 mg/dL   Triglycerides 161.0 (H) 0.0 - 149.0 mg/dL   HDL 96.04 >54.09 mg/dL   VLDL 81.1 (H) 0.0 - 91.4 mg/dL   LDL Cholesterol 782 (H) 0 - 99 mg/dL   Total CHOL/HDL Ratio 4    NonHDL 146.44     Assessment and Plan:     ICD-10-CM   1. Healthcare maintenance  Z00.00       Health Maintenance Exam: The patient's preventative maintenance and recommended screening tests for an annual wellness exam were reviewed in full today. Brought up to date unless services declined.  Counselled on the importance of diet, exercise, and its role in overall health and mortality. The patient's FH and SH was reviewed, including their home life, tobacco status, and drug and alcohol status.  Follow-up in 1 year for physical exam or additional follow-up below.  Disposition: No follow-ups on file.  No orders of the defined types were placed in this encounter.  There are no discontinued medications. No orders of the defined types were placed in this encounter.   Signed,  Elpidio Galea. Alaena Strader, MD   Allergies as of 08/23/2023       Reactions   Oxycodone-acetaminophen    REACTION: hallucinations   Penicillins    REACTION: reacted as a child        Medication List        Accurate as of August 22, 2023  2:31 PM. If you have any questions, ask your nurse or doctor.          fenofibrate 160 MG tablet Take 1 tablet by mouth once daily   FLUoxetine 20 MG capsule Commonly known as: PROZAC TAKE 1 CAPSULE BY MOUTH ONCE DAILY (WILL  NEED  TO  BE  SEEN  IN  OFFICE  FOR  MORE  REFILLS)   losartan 50 MG tablet Commonly known as: COZAAR Take 1 tablet by mouth once daily   MULTIVITAMIN GUMMIES ADULTS PO Take by mouth daily.

## 2023-08-23 ENCOUNTER — Encounter: Payer: Self-pay | Admitting: Family Medicine

## 2023-08-23 ENCOUNTER — Ambulatory Visit (INDEPENDENT_AMBULATORY_CARE_PROVIDER_SITE_OTHER): Payer: PPO | Admitting: Family Medicine

## 2023-08-23 VITALS — BP 138/70 | HR 84 | Temp 98.0°F | Ht 68.5 in | Wt 198.0 lb

## 2023-08-23 DIAGNOSIS — Z0000 Encounter for general adult medical examination without abnormal findings: Secondary | ICD-10-CM

## 2023-08-23 DIAGNOSIS — L989 Disorder of the skin and subcutaneous tissue, unspecified: Secondary | ICD-10-CM

## 2023-08-23 MED ORDER — ROSUVASTATIN CALCIUM 10 MG PO TABS: 10.0000 mg | ORAL_TABLET | Freq: Every day | ORAL | 3 refills | Status: DC

## 2023-08-23 MED ORDER — FENOFIBRATE 160 MG PO TABS: 160.0000 mg | ORAL_TABLET | Freq: Every day | ORAL | 3 refills | Status: DC

## 2023-08-23 MED ORDER — LOSARTAN POTASSIUM 50 MG PO TABS: 50.0000 mg | ORAL_TABLET | Freq: Every day | ORAL | 3 refills | Status: DC

## 2023-08-23 MED ORDER — FLUOXETINE HCL 20 MG PO CAPS: 20.0000 mg | ORAL_CAPSULE | Freq: Every day | ORAL | 3 refills | Status: DC

## 2023-09-20 ENCOUNTER — Encounter: Payer: Self-pay | Admitting: Dermatology

## 2023-09-20 ENCOUNTER — Ambulatory Visit: Payer: PPO | Admitting: Dermatology

## 2023-09-20 DIAGNOSIS — R21 Rash and other nonspecific skin eruption: Secondary | ICD-10-CM

## 2023-09-20 DIAGNOSIS — Z85828 Personal history of other malignant neoplasm of skin: Secondary | ICD-10-CM

## 2023-09-20 MED ORDER — DESONIDE 0.05 % EX CREA: TOPICAL_CREAM | Freq: Two times a day (BID) | CUTANEOUS | 0 refills | Status: DC

## 2023-09-20 NOTE — Progress Notes (Signed)
   New Patient Visit   Subjective  Randy Boyle is a 72 y.o. male who presents for the following: NP patient here today concerning a spot at left jaw area his pcp noticed a few weeks ago. Primary took photos Reports he is not sure how long spot has been there. But he does reports tingling and scratching at spot.   Patient reports seen by Dr. Gwen Pounds 3 years ago and reports a history of bcc at left forehead already treated.     The patient has spots, moles and lesions to be evaluated, some may be new or changing and the patient may have concern these could be cancer.   The following portions of the chart were reviewed this encounter and updated as appropriate: medications, allergies, medical history  Review of Systems:  No other skin or systemic complaints except as noted in HPI or Assessment and Plan.  Objective  Well appearing patient in no apparent distress; mood and affect are within normal limits.   A focused examination was performed of the following areas: Left preauricular, face   Relevant exam findings are noted in the Assessment and Plan.         Assessment & Plan    Rash Exam: erythematous thin slightly scaly plaque with focal hemorrhagic crusting on left preauricular and inferior auricular skin See photos   Differential diagnosis:  Contact dermatitis vs actinic change vs occult skin malignancy  Treatment Plan: Start Desonide 0.05 % cream -  apply twice daily to red inflamed areas at aa's until skin is smooth and redness is gone  Avoid shaving and picking at area  Will recheck in 2 weeks to ensure improvement and check for occult malignancy   HISTORY OF BASAL CELL CARCINOMA OF THE SKIN Left forehead above mid brow 03/04/2020 See [facility] pathology  Accession: [id number]  Treated with ED&C  - No evidence of recurrence today - Recommend regular full body skin exams - Recommend daily broad spectrum sunscreen SPF 30+ to sun-exposed areas, reapply  every 2 hours as needed.  - Call if any new or changing lesions are noted between office visits  RASH    Return in about 2 weeks (around 10/04/2023) for rash follow up.  I, Asher Muir, CMA, am acting as scribe for Elie Goody, MD.   Documentation: I have reviewed the above documentation for accuracy and completeness, and I agree with the above.  Elie Goody, MD

## 2023-09-20 NOTE — Patient Instructions (Addendum)
Avoid picking area Avoid shaving  Apply desonide 0.05 % cream apply twice daily to red inflamed area at left side of face until redness is gone and skin is smooth  Will recheck in 2 weeks     Due to recent changes in healthcare laws, you may see results of your pathology and/or laboratory studies on MyChart before the doctors have had a chance to review them. We understand that in some cases there may be results that are confusing or concerning to you. Please understand that not all results are received at the same time and often the doctors may need to interpret multiple results in order to provide you with the best plan of care or course of treatment. Therefore, we ask that you please give [place] 2 business days to thoroughly review all your results before contacting the office for clarification. Should we see a critical lab result, you will be contacted sooner.   If You Need Anything After Your Visit  If you have any questions or concerns for your doctor, please call our main line at [phone] and press option 4 to reach your doctor's medical assistant. If no one answers, please leave a voicemail as directed and we will return your call as soon as possible. Messages left after 4 pm will be answered the following business day.   You may also send [place] a message via MyChart. We typically respond to MyChart messages within 1-2 business days.  For prescription refills, please ask your pharmacy to contact our office. Our fax number is [phone].  If you have an urgent issue when the clinic is closed that cannot wait until the next business day, you can page your doctor at the number below.    Please note that while we do our best to be available for urgent issues outside of office hours, we are not available 24/7.   If you have an urgent issue and are unable to reach [place], you may choose to seek medical care at your doctor's office, retail clinic, urgent care center, or emergency room.  If you  have a medical emergency, please immediately call 911 or go to the emergency department.  Pager Numbers  - Dr. Gwen Pounds: [phone]  - Dr. Roseanne Reno: [phone]  - Dr. Katrinka Blazing: [phone]   In the event of inclement weather, please call our main line at [phone] for an update on the status of any delays or closures.  Dermatology Medication Tips: Please keep the boxes that topical medications come in in order to help keep track of the instructions about where and how to use these. Pharmacies typically print the medication instructions only on the boxes and not directly on the medication tubes.   If your medication is too expensive, please contact our office at [phone] option 4 or send [place] a message through MyChart.   We are unable to tell what your co-pay for medications will be in advance as this is different depending on your insurance coverage. However, we may be able to find a substitute medication at lower cost or fill out paperwork to get insurance to cover a needed medication.   If a prior authorization is required to get your medication covered by your insurance company, please allow [place] 1-2 business days to complete this process.  Drug prices often vary depending on where the prescription is filled and some pharmacies may offer cheaper prices.  The website www.goodrx.com contains coupons for medications through different pharmacies. The prices here do not account for what  the cost may be with help from insurance (it may be cheaper with your insurance), but the website can give you the price if you did not use any insurance.  - You can print the associated coupon and take it with your prescription to the pharmacy.  - You may also stop by our office during regular business hours and pick up a GoodRx coupon card.  - If you need your prescription sent electronically to a different pharmacy, notify our office through [facility] or by phone at [phone] option  4.     Si Usted Necesita Algo Despus de Su Visita  Tambin puede enviarnos un mensaje a travs de Clinical cytogeneticist. Por lo general respondemos a los mensajes de MyChart en el transcurso de 1 a 2 das hbiles.  Para renovar recetas, por favor pida a su farmacia que se ponga en contacto con nuestra oficina. Annie Sable de fax es [place] [phone].  Si tiene un asunto urgente cuando la clnica est cerrada y que no puede esperar hasta el siguiente da hbil, puede llamar/localizar a su doctor(a) al nmero que aparece a continuacin.   Por favor, tenga en cuenta que aunque hacemos todo lo posible para estar disponibles para asuntos urgentes fuera del horario de [place], no estamos disponibles las 24 horas del da, los 7 [address]  [address] de la [place].   Si tiene un problema urgente y no puede comunicarse con nosotros, puede optar por buscar atencin mdica  en el consultorio de su doctor(a), en una clnica privada, en un centro de atencin urgente o en una sala de emergencias.  Si tiene Engineer, drilling, por favor llame inmediatamente al 911 o vaya a la sala de emergencias.  Nmeros de bper  - Dr. Gwen Pounds: [phone]  - Dra. Roseanne Reno: [phone]  - Dr. Katrinka Blazing: [phone]   En caso de inclemencias del tiempo, por favor llame a Lacy Duverney principal al [phone] para una actualizacin sobre el [place] de cualquier retraso o cierre.  Consejos para la medicacin en dermatologa: Por favor, guarde las cajas en las que vienen los medicamentos de uso tpico para ayudarle a seguir las instrucciones sobre dnde y cmo usarlos. Las farmacias generalmente imprimen las instrucciones del medicamento slo en las cajas y no directamente en los tubos del [place].   Si su medicamento es muy caro, por favor, pngase en contacto con Rolm Gala llamando al [phone] y presione la opcin 4 o envenos un mensaje a travs de Clinical cytogeneticist.   No podemos decirle cul ser su copago por los medicamentos por  adelantado ya que esto es diferente dependiendo de la cobertura de su seguro. Sin embargo, es posible que podamos encontrar un medicamento sustituto a Audiological scientist un formulario para que el seguro cubra el medicamento que se considera necesario.   Si se requiere una autorizacin previa para que su compaa de seguros [place] su medicamento, por favor permtanos de 1 a 2 das hbiles para completar [address]  Los precios de los medicamentos varan con frecuencia dependiendo del Environmental consultant de dnde se surte la receta y alguna farmacias pueden ofrecer precios ms baratos.  El sitio web www.goodrx.com tiene cupones para medicamentos de Health and safety inspector. Los precios aqu no tienen en cuenta lo que podra costar con la ayuda del seguro (puede ser ms barato con su seguro), pero el sitio web puede darle el precio si no utiliz Tourist information centre manager.  - Puede imprimir el cupn correspondiente y llevarlo con su receta a la farmacia.  - Tambin puede  pasar por nuestra oficina durante el horario de atencin regular y Education officer, museum una tarjeta de cupones de GoodRx.  - Si necesita que su receta se enve electrnicamente a una farmacia diferente, informe a nuestra oficina a travs de MyChart de [place] o por telfono llamando al [phone] y presione la opcin 4.

## 2023-10-05 ENCOUNTER — Ambulatory Visit: Payer: PPO | Admitting: Dermatology

## 2023-10-05 DIAGNOSIS — C4491 Basal cell carcinoma of skin, unspecified: Secondary | ICD-10-CM

## 2023-10-05 DIAGNOSIS — C44310 Basal cell carcinoma of skin of unspecified parts of face: Secondary | ICD-10-CM | POA: Diagnosis not present

## 2023-10-05 DIAGNOSIS — D492 Neoplasm of unspecified behavior of bone, soft tissue, and skin: Secondary | ICD-10-CM

## 2023-10-05 DIAGNOSIS — C44311 Basal cell carcinoma of skin of nose: Secondary | ICD-10-CM | POA: Diagnosis not present

## 2023-10-05 DIAGNOSIS — C44219 Basal cell carcinoma of skin of left ear and external auricular canal: Secondary | ICD-10-CM | POA: Diagnosis not present

## 2023-10-05 HISTORY — DX: Basal cell carcinoma of skin, unspecified: C44.91

## 2023-10-05 NOTE — Progress Notes (Signed)
   Follow-Up Visit   Subjective  Randy Boyle is a 73 y.o. male who presents for the following: Contact Derm vs Actinic change vs Occult skin malignancy L preauricular and inferior auricular, 2 wk f/u, Desonide  cr bid, pt not sure if improved, still itches prn The patient has spots, moles and lesions to be evaluated, some may be new or changing and the patient may have concern these could be cancer.   The following portions of the chart were reviewed this encounter and updated as appropriate: medications, allergies, medical history  Review of Systems:  No other skin or systemic complaints except as noted in HPI or Assessment and Plan.  Objective  Well appearing patient in no apparent distress; mood and affect are within normal limits.   A focused examination was performed of the following areas: face  Relevant exam findings are noted in the Assessment and Plan.  L inferior auricular erythematous thin slightly scaly plaque with focal hemorrhagic crusting on left preauricular and inferior auricular skin   L nasolabial fold 9.91mm pink pearly pap with central ulcer and telangiectasias   Assessment & Plan     NEOPLASM OF SKIN (2) L inferior auricular Skin / nail biopsy Type of biopsy: tangential   Informed consent: discussed and consent obtained   Timeout: patient name, date of birth, surgical site, and procedure verified   Procedure prep:  Patient was prepped and draped in usual sterile fashion Prep type:  Isopropyl alcohol Anesthesia: the lesion was anesthetized in a standard fashion   Anesthetic:  1% lidocaine w/ epinephrine 1-100,000 buffered w/ 8.4% NaHCO3 Instrument used: DermaBlade   Hemostasis achieved with: pressure and aluminum chloride   Outcome: patient tolerated procedure well   Post-procedure details: sterile dressing applied and wound care instructions given   Dressing type: bandage and bacitracin   Specimen 1 - Surgical pathology Differential Diagnosis: BCC  vs SCC vs Dermatitis  Check Margins: No erythematous thin slightly scaly plaque with focal hemorrhagic crusting on left preauricular and inferior auricular skin 2 pieces L nasolabial fold Skin / nail biopsy Type of biopsy: tangential   Informed consent: discussed and consent obtained   Timeout: patient name, date of birth, surgical site, and procedure verified   Procedure prep:  Patient was prepped and draped in usual sterile fashion Prep type:  Isopropyl alcohol Anesthesia: the lesion was anesthetized in a standard fashion   Anesthetic:  1% lidocaine w/ epinephrine 1-100,000 buffered w/ 8.4% NaHCO3 Instrument used: DermaBlade   Hemostasis achieved with: pressure and aluminum chloride   Outcome: patient tolerated procedure well   Post-procedure details: sterile dressing applied and wound care instructions given   Dressing type: bandage and bacitracin   Specimen 2 - Surgical pathology Differential Diagnosis: R/O BCC  Check Margins: No 9.52mm pink pearly pap with central ulcer and telangiectasias Discussed mohs for L nasolabial fold if BCC  Return for PRN pending bx results.  I, Grayce Saunas, RMA, am acting as scribe for Boneta Sharps, MD .   Documentation: I have reviewed the above documentation for accuracy and completeness, and I agree with the above.  Boneta Sharps, MD

## 2023-10-05 NOTE — Patient Instructions (Addendum)

## 2023-10-07 ENCOUNTER — Encounter: Payer: Self-pay | Admitting: Dermatology

## 2023-10-08 LAB — SURGICAL PATHOLOGY

## 2023-10-11 ENCOUNTER — Telehealth: Payer: Self-pay

## 2023-10-11 DIAGNOSIS — C44310 Basal cell carcinoma of skin of unspecified parts of face: Secondary | ICD-10-CM

## 2023-10-11 MED ORDER — IMIQUIMOD 5 % EX CREA: TOPICAL_CREAM | CUTANEOUS | 2 refills | Status: DC

## 2023-10-11 NOTE — Telephone Encounter (Signed)
-----   Message from [facility] sent at 10/10/2023  9:22 PM EST ----- Diagnosis: 1. Skin, L inferior auricular :       SUPERFICIAL BASAL CELL CARCINOMA        2. Skin, L nasolabial fold :       BASAL CELL CARCINOMA, NODULAR PATTERN    Please call with diagnosis and message me with patient's decision on treatment. Please schedule FBSE in 3 months  1. L inferior auricular  Explanation: your biopsy shows a basal cell skin cancer in the top layer of the skin. This is the most common kind of skin cancer and is caused by damage from sun exposure. Basal cell skin cancers almost never spread beyond the skin, so they are not dangerous to your overall health. However, they will continue to grow, can bleed, cause nonhealing wounds, and disrupt nearby structures unless fully treated.  Treatment option 1: Mohs surgery involves cutting out the skin cancer and then checking under the microscope to ensure the whole skin cancer was removed. If any skin cancer remains, the surgeon will cut out more until it is fully removed. The cure rate is about 98-99%. Once the Mohs surgeon confirms the skin cancer is out, they will discuss the options to repair or heal the area. You must take it easy for about two weeks after surgery (no lifting over 10-15 lbs, avoid activity to get your heart rate and blood pressure up). It is done at another office outside of [place] ([place], [place], or  [place]).  Treatment option 2: imiquimod is a cream that helps your immune system clear the skin cancer. You will apply the cream on weekdays for 6 weeks. It has an 88% cure rate. If redness and irritation develop, take 3 days off before restarting. If an open sore develops, stop the cream and send [place] a message on MyChart or call us. If the cream does not clear the cancer, surgery will be required. ##please make 3 month follow up for recheck (can be the same as FBSE appointment)  For option 2: Free text instructions and copy  paste: "apply cream to your skin cancer once daily on 5 days per week, prior to normal sleeping hours, for 6 weeks. Leave it on your skin for ~8 hours, then remove with mild soap and water. Apply enough cream to cover the treatment area and a quarter inch of skin surrounding the tumor. If redness and irritation develop, take 3 days off then restart." Prescribe 12 packets with 2 refills.  2. L nasolabial fold  Explanation: your biopsy shows a basal cell skin cancer in the second layer of the skin.  Treatment: Given the location and type of skin cancer, I recommend Mohs surgery.

## 2023-10-11 NOTE — Telephone Encounter (Signed)
Patient advised pathology results. Sent in imiquimod to [facility] referral sent to Dr. Caralyn Guile, scheduled for FBSE and follow up 4/21. Butch Penny., RMA

## 2023-10-20 ENCOUNTER — Encounter: Payer: Self-pay | Admitting: Dermatology

## 2023-10-21 ENCOUNTER — Encounter: Payer: Self-pay | Admitting: Dermatology

## 2023-10-21 ENCOUNTER — Ambulatory Visit: Payer: PPO | Admitting: Dermatology

## 2023-10-21 VITALS — BP 135/74 | HR 88 | Temp 97.9°F

## 2023-10-21 DIAGNOSIS — C4491 Basal cell carcinoma of skin, unspecified: Secondary | ICD-10-CM

## 2023-10-21 DIAGNOSIS — L814 Other melanin hyperpigmentation: Secondary | ICD-10-CM

## 2023-10-21 DIAGNOSIS — C44319 Basal cell carcinoma of skin of other parts of face: Secondary | ICD-10-CM | POA: Diagnosis not present

## 2023-10-21 DIAGNOSIS — L579 Skin changes due to chronic exposure to nonionizing radiation, unspecified: Secondary | ICD-10-CM | POA: Diagnosis not present

## 2023-10-21 DIAGNOSIS — C44310 Basal cell carcinoma of skin of unspecified parts of face: Secondary | ICD-10-CM

## 2023-10-21 MED ORDER — TRAMADOL HCL 50 MG PO TABS: 50.0000 mg | ORAL_TABLET | Freq: Four times a day (QID) | ORAL | 0 refills | Status: DC | PRN

## 2023-10-21 NOTE — Patient Instructions (Signed)
Important Information  Due to recent changes in healthcare laws, you may see results of your pathology and/or laboratory studies on MyChart before the doctors have had a chance to review them. We understand that in some cases there may be results that are confusing or concerning to you. Please understand that not all results are received at the same time and often the doctors may need to interpret multiple results in order to provide you with the best plan of care or course of treatment. Therefore, we ask that you please give [place] 2 business days to thoroughly review all your results before contacting the office for clarification. Should we see a critical lab result, you will be contacted sooner.   If You Need Anything After Your Visit  If you have any questions or concerns for your doctor, please call our main line at [phone] If no one answers, please leave a voicemail as directed and we will return your call as soon as possible. Messages left after 4 pm will be answered the following business day.   You may also send [place] a message via MyChart. We typically respond to MyChart messages within 1-2 business days.  For prescription refills, please ask your pharmacy to contact our office. Our fax number is [phone].  If you have an urgent issue when the clinic is closed that cannot wait until the next business day, you can page your doctor at the number below.    Please note that while we do our best to be available for urgent issues outside of office hours, we are not available 24/7.   If you have an urgent issue and are unable to reach [place], you may choose to seek medical care at your doctor's office, retail clinic, urgent care center, or emergency room.  If you have a medical emergency, please immediately call 911 or go to the emergency department. In the event of inclement weather, please call our main line at [phone] for an update on the status of any delays or  closures.  Dermatology Medication Tips: Please keep the boxes that topical medications come in in order to help keep track of the instructions about where and how to use these. Pharmacies typically print the medication instructions only on the boxes and not directly on the medication tubes.   If your medication is too expensive, please contact our office at [phone] or send [place] a message through MyChart.   We are unable to tell what your co-pay for medications will be in advance as this is different depending on your insurance coverage. However, we may be able to find a substitute medication at lower cost or fill out paperwork to get insurance to cover a needed medication.   If a prior authorization is required to get your medication covered by your insurance company, please allow [place] 1-2 business days to complete this process.  Drug prices often vary depending on where the prescription is filled and some pharmacies may offer cheaper prices.  The website www.goodrx.com contains coupons for medications through different pharmacies. The prices here do not account for what the cost may be with help from insurance (it may be cheaper with your insurance), but the website can give you the price if you did not use any insurance.  - You can print the associated coupon and take it with your prescription to the pharmacy.  - You may also stop by our office during regular business hours and pick up a GoodRx coupon card.  - If  you need your prescription sent electronically to a different pharmacy, notify our office through [facility] or by phone at [phone]    Wound Care Instructions for After Surgery  On the day following your surgery, you should begin doing daily dressing changes until your sutures are removed: Remove the bandage. Cleanse the wound gently with soap and water.  Make sure you then dry the skin surrounding the wound completely or the tape will not stick to the skin. Do not  use cotton balls on the wound. After the wound is clean and dry, apply the ointment (either prescription antibiotic prescribed by your doctor or plain Vaseline if nothing was prescribed) gently with a Q-tip. If you are using a bandaid to cover: Apply a bandaid large enough to cover the entire wound. If you do not have a bandaid large enough to cover the wound OR if you are sensitive to bandaid adhesive: Cut a non-stick pad (such as Telfa) to fit the size of the wound.  Cover the wound with the non-stick pad. If the wound is draining, you may want to add a small amount of gauze on top of the non-stick pad for a little added compression to the area. Use tape to seal the area completely.  For the next 1-2 weeks: Be sure to keep the wound moist with ointment 24/7 to ensure best healing. If you are unable to cover the wound with a bandage to hold the ointment in place, you may need to reapply the ointment several times a day. Do not bend over or lift heavy items to reduce the chance of elevated blood pressure to the wound. Do not participate in particularly strenuous activities.  Below is a list of dressing supplies you might need.  Cotton-tipped applicators - Q-tips Gauze pads (2x2 and/or 4x4) - All-Purpose Sponges New and clean tube of petroleum jelly (Vaseline) OR prescription antibiotic ointment if prescribed Either a bandaid large enough to cover the entire wound OR non-stick dressing material (Telfa) and Tape (Paper or Hypafix)  FOR ADULT SURGERY PATIENTS: If you need something for pain relief, you may take 1 extra strength Tylenol (acetaminophen) and 2 ibuprofen (200 mg) together every 4 hours as needed. (Do not take these medications if you are allergic to them or if you know you cannot take them for any other reason). Typically you may only need pain medication for 1-3 days.   Comments on the Post-Operative Period Slight swelling and redness often appear around the wound. This is normal  and will disappear within several days following the surgery. The healing wound will drain a brownish-red-yellow discharge during healing. This is a normal phase of wound healing. As the wound begins to heal, the drainage may increase in amount. Again, this drainage is normal. Notify us if the drainage becomes persistently bloody, excessively swollen, or intensely painful or develops a foul odor or red streaks.  The healing wound will also typically be itchy. This is normal. If you have severe or persistent pain, Notify us if the discomfort is severe or persistent. Avoid alcoholic beverages when taking pain medicine.  In Case of Wound Hemorrhage A wound hemorrhage is when the bandage suddenly becomes soaked with bright red blood and flows profusely. If this happens, sit down or lie down with your head elevated. If the wound has a dressing on it, do not remove the dressing. Apply pressure to the existing gauze. If the wound is not covered, use a gauze pad to apply pressure and  continue applying the pressure for 20 minutes without peeking. DO NOT COVER THE WOUND WITH A LARGE TOWEL OR WASH CLOTH. Release your hand from the wound site but do not remove the dressing. If the bleeding has stopped, gently clean around the wound. Leave the dressing in place for 24 hours if possible. This wait time allows the blood vessels to close off so that you do not spark a new round of bleeding by disrupting the newly clotted blood vessels with an immediate dressing change. If the bleeding does not subside, continue to hold pressure for 40 minutes. If bleeding continues, page your physician, contact an After Hours clinic or go to the Emergency Room.

## 2023-10-21 NOTE — Progress Notes (Signed)
Follow-Up Visit   Subjective  Randy Boyle is a 73 y.o. 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 Katrinka Blazing.  He also mentions a lesion on his left preauricular region, biopsied as Superficial BCC and starting treatment with imiquimod but painful and numb and present for 3 years.  The following portions of the chart were reviewed this encounter and updated as appropriate: medications, allergies, medical history  Review of Systems:  No other skin or systemic complaints except as noted in HPI or Assessment and Plan.  Objective  Well appearing patient in no apparent distress; mood and affect are within normal limits.  A focused examination was performed of the following areas:  Nose  Relevant physical exam findings are noted in the Assessment and Plan.   Left nasolabial fold Pink pearly papule or plaque with arborizing vessels.   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 No   Was the anticoagulation regimen changed prior to Mohs? No    Anesthesia: Anesthesia method: local infiltration Local anesthetic: lidocaine 1% WITH epi  Procedure Details: Timeout: pre-procedure verification complete Procedure Prep: patient was prepped and draped in usual sterile fashion Prep type: chlorhexidine Biopsy accession number: [id number] Biopsy lab: [facility] Path Frozen section biopsy  performed: No   Specimen debulked: No   Pre-Op diagnosis: basal cell carcinoma BCC subtype: nodular MohsAIQ Surgical site (if tumor spans multiple areas, please select predominant area): cheek (including jawline) Surgery side: left Surgical site (from skin exam): Left nasolabial fold Pre-operative length (cm): 1 Pre-operative width (cm): 1.2 Indications for Mohs surgery: anatomic location where tissue conservation is critical Previously treated? No    Micrographic Surgery Details: Post-operative length (cm): 2.2 Post-operative width (cm): 2 Number of Mohs stages: 2 Is this a complex case (associate members only): No    Reconstruction: Was the defect reconstructed? Yes   Was reconstruction performed by the same Mohs surgeon? Yes   Setting of reconstruction: outpatient office When was reconstruction performed? same day Type of reconstruction: linear Linear reconstruction: complex  Opioids: Did the patient receive a prescription for opioid/narcotic related to Mohs surgery? Yes   Indications for opioid/narcotics: patient required additional pain relief despite trial of non-opioid analgesia  Antibiotics: Does patient meet AHA guidelines for endocarditis?: No   Does patient meet AHA guidelines for orthopedic prophylaxis?: No   Were antibiotics given on the day of surgery?: No   Did surgery breach mucosa, expose cartilage/bone, involve an area of lymphedema/inflamed/infected tissue? No   Related Medications traMADol (ULTRAM) 50 MG tablet Take 1 tablet (50 mg total) by mouth every 6 (six) hours as needed for up to 10 doses. BASAL CELL CARCINOMA (BCC), UNSPECIFIED SITE (3) Left Preauricular - A Skin / nail biopsy Type of biopsy: punch   Informed consent: discussed and consent obtained   Timeout: patient name, date of birth, surgical site, and procedure verified   Procedure prep:  Patient was prepped and draped in usual sterile fashion Prep type:  Chlorhexidine Anesthesia: the  lesion was anesthetized in a standard fashion   Anesthetic:  1% lidocaine w/ epinephrine 1-100,000 buffered w/ 8.4% NaHCO3 Punch size:  5 mm Hemostasis achieved with: pressure and electrodesiccation   Outcome: patient tolerated procedure well   Post-procedure details: sterile dressing applied and wound care instructions given   Dressing type: pressure dressing and bandage   Additional details:  7.2 x 7.7  Specimen 1 - Surgical pathology Differential Diagnosis: Morpheaform and Nodular BCC  Check PNI and run cytokeratin stains Left Preauricular - B Skin / nail biopsy Type of biopsy: punch   Informed consent: discussed and consent obtained   Timeout: patient name, date of birth, surgical site, and procedure verified   Procedure prep:  Patient was prepped and draped in usual sterile fashion Prep type:  Isopropyl alcohol Anesthesia: the lesion was anesthetized in a standard fashion   Anesthetic:  1% lidocaine w/ epinephrine 1-100,000 buffered w/ 8.4% NaHCO3 Punch size:  5 mm Hemostasis achieved with: pressure, aluminum chloride and electrodesiccation   Outcome: patient tolerated procedure well   Post-procedure details: sterile dressing applied and wound care instructions given   Dressing type: bandage and petrolatum   Specimen 2 - Surgical pathology Differential Diagnosis: Nodular and infiltrative BCC   Left Preauricular - C Skin / nail biopsy Type of biopsy: punch   Informed consent: discussed and consent obtained   Timeout: patient name, date of birth, surgical site, and procedure verified   Procedure prep:  Patient was prepped and draped in usual sterile fashion Prep type:  Isopropyl alcohol Anesthesia: the lesion was anesthetized in a standard fashion   Anesthetic:  1% lidocaine w/ epinephrine 1-100,000 buffered w/ 8.4% NaHCO3 Punch size:  5 mm Hemostasis achieved with: pressure, aluminum chloride and electrodesiccation   Outcome: patient tolerated procedure well    Post-procedure details: sterile dressing applied and wound care instructions given   Dressing type: bandage and petrolatum   Specimen 3 - Surgical pathology Differential Diagnosis: Basosquamous and inflitrative BCC  Check PNI and run cytokeratin stains  Superficial Basal Cell Carcinoma- left preauricular region- Large pink plaque with central areas of ulceration with hemorrhagic crusting, concerning for more invasive component - Patient currently undergoing topical therapy with imiquimod for superficial BCC - The patient was evaluated for a large ulcerating, indurated plaque on the left jawline and left preauricular region, which appears to have a deeper and more aggressive histology than the initially biopsied superficial basal cell carcinoma. Concerning symptoms, including pain, numbness, and changes in swallowing abilities, raise suspicion for possible perineural involvement or a more aggressive malignancy. The case was discussed with the primary dermatologist, Dr. Katrinka Blazing, to request permission for scouting biopsies today to further assess the lesion. Dr. Katrinka Blazing agreed with this approach, and we decided to proceed with scouting biopsies of clinically distinct edges within the same lesion to confirm diagnosis and guide further management. The patient was informed of the rationale and risks, and all questions were addressed  Frozen tissue sections were obtained for intraoperative evaluation. The tissue was carefully excised and immediately placed in optimal cutting temperature (OCT) compound before being rapidly frozen using a cryostat. Vertical sections were then cut at 10 microns, mounted on glass slides, and stained with hematoxylin and eosin (H&E) for microscopic examination. Three separate blocks were processed, and each was assessed for histopathologic features.  Block A: Grossly, hemorrhagic crusted ulcer (1.2 x 1.0 x 0.5 cm). Microscopically, sclerosing (morphea-like) subtype is composed of  spiky, basaloid, thin strands of cells  that invade the dermis, surrounded by dense fibrous stroma and islands of cells with peripheral palisading and a haphazard arrangement of the more central cells. Block B: Grossly, ulcerated, pink nodule (0.6 x 0.6 x 0.4 cm). Microscopically, islands of cells with peripheral palisading and a haphazard arrangement of the more central cells and  multiple, small buds of basaloid cells descending from the epidermis and . Block C: Grossly, ulcerated, pink nodule (0.6 x 0.6 x 0.3 cm). Microscopically, thin cords or strands of basaloid tumor cells that deeply infiltrate the surrounding stroma, often with irregular, angulated edges, appearing as a permeating invasion pattern at the tumor margins; these strands are embedded within a dense, collagenous stroma, with less prominent peripheral palisading and retraction; and presence of islands of basaloid cells with peripheral palisading alongside areas of squamous differentiation with keratinization and intercellular bridges  Findings were discussed in real time to guide surgical management. The patient's case will be further reviewed for final histopathologic diagnosis and treatment planning. Due to potential risk of Perineural Invasion and potential collision tumor with squamatization, recommend cytokeratin stains and permanent sections.  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  - Plan to schedule for Mohs surgery with me - Discussed risks and benefits of Mohs surgery, including risks of bleeding, pain, numbness, scarring, and potential reconstruction including linear closures, grafts, flaps, delayed closure, or secondary intention - Discussed that Mohs surgery has the highest cure rate for treatment of NMSCs (99%) - Discussed expectations of day of surgery including local anesthesia, waiting for lab process to find out pathology  results, and same day reconstruction, potentially including flaps and grafts - We discussed the procedure, which involves the removal of cancerous tissue in thin layers, with each layer being examined under a microscope until clear margins are achieved. The patient was informed that this technique is highly effective in treating certain skin cancers, especially those in cosmetically sensitive or challenging areas. Potential risks, including infection, scarring, and changes in skin appearance, were reviewed. The importance of post-operative care, including wound management and follow-up visits, was emphasized to ensure optimal healing and monitoring for any recurrence. The patient was encouraged to ask any questions and provided with written instructions on the procedure and recovery process   Return for Mohs of left preauricular.  Owens Shark, CMA, am acting as scribe for Gwenith Daily, MD.    10/21/2023  HISTORY OF PRESENT ILLNESS  Randy Boyle is seen in consultation at the request of Dr. Katrinka Blazing for biopsy-proven nodular Basal Cell Carcinoma on the left cheek. They note that the area has been present for about 1 year increasing in size with time and tenderness.  There is no history of previous treatment.  Reports no other new or changing lesions and has no other complaints today.  Medications and allergies: see patient chart.  Review of systems: Reviewed 8 systems and notable for the above skin cancer.  All other systems reviewed are unremarkable/negative, unless noted in the HPI. Past medical history, surgical history, family history, social history were also reviewed and are noted in the chart/questionnaire.    PHYSICAL EXAMINATION  General: Well-appearing, in no acute distress, alert and oriented x 4. Vitals reviewed in chart (if available).   Skin: Exam reveals a 1.0 x 1.2 cm erythematous papule and biopsy scar on the left cheek. There are rhytids, telangiectasias, and lentigines,  consistent with photodamage. Lymph nodes: mild fullness on left cervical lymph  nodes  Biopsy report(s) reviewed, confirming the diagnosis.   ASSESSMENT  1) Nodular Basal Cell Carcinoma of the left cheek 2) photodamage 3) solar lentigines   PLAN   1. Due to location, size, histology, or recurrence and the likelihood of subclinical extension as well as the need to conserve normal surrounding tissue, the patient was deemed acceptable for Mohs micrographic surgery (MMS).  The nature and purpose of the procedure, associated benefits and risks including recurrence and scarring, possible complications such as pain, infection, and bleeding, and alternative methods of treatment if appropriate were discussed with the patient during consent. The lesion location was verified by the patient, by reviewing previous notes, pathology reports, and by photographs as well as angulation measurements if available.  Informed consent was reviewed and signed by the patient, and timeout was performed at 8:30 AM. See op note below.  2. For the photodamage and solar lentigines, sun protection discussed/information given on OTC sunscreens, and we recommend continued regular follow-up with primary dermatologist every 6 months or sooner for any growing, bleeding, or changing lesions. 3. Prognosis and future surveillance discussed. 4. Letter with treatment outcome sent to referring provider. 5. Pain acetaminophen/ibuprofen/tramadol 50 mg  MOHS MICROGRAPHIC SURGERY AND RECONSTRUCTION  Initial size:   1.0 x 1.2 cm Surgical defect/wound size: 2.2 x 2.0 cm Anesthesia:    0.33% lidocaine with 1:200,000 epinephrine EBL:    <5 mL Complications:  None Repair type:   Adjacent Tissue Transfer (Advancement Flap) SQ suture:   5-0 Monocryl and 4-0 Vicryl Cutaneous suture:  5-0 Polyprolene Final size of the repair: 3.4 x 3.4 cm = 11.56 cm^2  Stages: 2  STAGE I: Anesthesia achieved with 0.5% lidocaine with 1:200,000 epinephrine.  ChloraPrep applied. 1 section(s) excised using Mohs technique (this includes total peripheral and deep tissue margin excision and evaluation with frozen sections, excised and interpreted by the same physician). The tumor was first debulked and then excised with an approx. 2 mm margin.  Hemostasis was achieved with electrocautery as needed.  The specimen was then oriented, subdivided/relaxed, inked, and processed using Mohs technique.    Frozen section analysis revealed a positive margin for islands of cells with peripheral palisading and a haphazard arrangement of the more central cells in the peripheral margin.    STAGE II: An additional 2 mm margin was excised.  Hemostasis was achieved with electrocautery as needed.  The specimen was then oriented, subdivided/relaxed, inked, and processed using Mohs technique. Evaluation of slides by the Mohs surgeon revealed clear tumor margins.  Reconstruction  PROCEDURE: Advancement Flap The nature of the procedure was discussed with the patient in detail, including alternatives.  The risks discussed included but not limited to potential for infection, bleeding, scar formation, and damage to underlying structures.  The patient understood the risks and signed the consent form (scanned into chart).  This wound was reconstructed with an advancement flap.  Local anesthesia was achieved with the anesthetic indicated above.  The operative site was prepped with a surgical antiseptic solution, and then draped with sterile towels to insure a sterile field.  The beveled edges of the wound were excised to 90 degrees relative to the surface skin plane.  The wound was undermined in all directions, and meticulous hemostasis was achieved with an electrosurgical device.  Relaxing incisions were made, if necessary, for lateral tension release.  The tissue was advanced and closed centrally, and redundant tissue was trimmed as necessary.  The wound was sutured in a layered fashion to  close  potential dead space and to precisely and securely approximate the wound edges.  The dimensions of the flap were:  3.4 cm x 3.4 cm for a total flap surface area of 11.56 centimeters squared (cm2).    The wound was covered with petrolatum and a dressing.  The patient understands the need to return immediately for any signs of infection to include swelling, pain, purulent discharge, localized warmth, or fever.  Contact information was provided to the patient (including after-hours pager numbers)    Documentation: I have reviewed the above documentation for accuracy and completeness, and I agree with the above.  We spent 45 min reviewing records, taking the patient history, providing face to face care with the patient, sending prescriptions.   Gwenith Daily, MD

## 2023-10-26 LAB — SURGICAL PATHOLOGY

## 2023-10-27 ENCOUNTER — Telehealth: Payer: Self-pay

## 2023-10-27 NOTE — Telephone Encounter (Signed)
 L/m for pt confirming dx and that correct treatment is scheduled

## 2023-10-27 NOTE — Telephone Encounter (Signed)
-----   Message from [facility] sent at 10/27/2023  3:01 PM EST ----- Please confirm with patient that pathologist agreed with Dr. Shawn Delay microscopic read. Infiltrative BCC, pt already scheduled for Mohs.

## 2023-10-28 ENCOUNTER — Ambulatory Visit: Payer: PPO | Admitting: Dermatology

## 2023-10-28 ENCOUNTER — Encounter: Payer: Self-pay | Admitting: Dermatology

## 2023-10-28 VITALS — BP 130/80 | HR 81

## 2023-10-28 DIAGNOSIS — C44310 Basal cell carcinoma of skin of unspecified parts of face: Secondary | ICD-10-CM

## 2023-10-28 DIAGNOSIS — L905 Scar conditions and fibrosis of skin: Secondary | ICD-10-CM

## 2023-10-28 DIAGNOSIS — R55 Syncope and collapse: Secondary | ICD-10-CM

## 2023-10-28 NOTE — Progress Notes (Addendum)
 Follow Up Visit   Subjective  Randy Boyle is a 73 y.o. male who presents for the following: follow up from Mohs surgery   The patient presents for follow up from Mohs surgery for a BCC on the left nasolabial fold, treated on 10/21/23, repaired with linear repair. The patient has been bandaging the wound as directed. The endorse the following concerns: mild discomfort  Patient mentions that he experienced several instances of collapsing when eating or drinking about 24 hours after the procedure. He is no longer experiencing this.   The following portions of the chart were reviewed this encounter and updated as appropriate: medications, allergies, medical history  Review of Systems:  No other skin or systemic complaints except as noted in HPI or Assessment and Plan.  Objective  Well appearing patient in no apparent distress; mood and affect are within normal limits.  A full examination was performed including scalp, head, face. All findings within normal limits unless otherwise noted below.  Healing wound with mild erythema  Relevant physical exam findings are noted in the Assessment and Plan.     Assessment & Plan   Healing s/p Mohs for [facility], treated on 10/21/23, repaired with advancement flap - Reassured that wound is healing well - No evidence of infection - No swelling, induration, purulence, dehiscence, or tenderness out of proportion to the clinical exam, see photo above - Discussed that scars take up to 12 months to mature from the date of surgery - Recommend SPF 30+ to scar daily to prevent purple color from UV exposure during scar maturation process - Discussed that erythema and raised appearance of scar will fade over the next 4-6 months - OK to start scar massage at 4-6 weeks post-op - Can consider silicone based products for scar healing starting at 6 weeks post-op - Ok to continue ointment daily to wound under a bandage for another couple of weeks. - Sutures  removed  Swallow Syncope The patient was counseled regarding swallow syncope experienced postoperatively, which is not within the normal spectrum of expected post-surgical symptoms. It was explained that this condition is likely related to vagal nerve stimulation, leading to transient bradycardia and hypotension. While rare, swallow syncope can indicate underlying autonomic dysfunction or heightened vagal sensitivity. The importance of neurology evaluation prior to the next appointment was emphasized to assess the condition further and rule out other potential causes. The patient was advised to monitor symptoms, avoid potential triggers, and seek immediate medical attention if episodes worsen or lead to loss of consciousness. Follow-up will be coordinated based on neurology's assessment and recommendations. - Ambulatory referral to Neurology  Infiltrative Basal Cell Carcinoma on the left jawline The patient was counseled regarding the large infiltrative basal cell carcinoma (BCC) on the left jawline, which was biopsied at the last visit and confirmed by permanent sections. Given the lesion's size and infiltrative nature, treatment with Mohs micrographic surgery was recommended to ensure complete tumor clearance while preserving as much healthy tissue as possible. It was discussed that due to the extensive involvement, the process may require multiple stages over several days to fully excise the tumor before proceeding with reconstruction. The patient was informed about the potential need for interim wound care between stages and the importance of close follow-up throughout the process. Risks, benefits, and expectations of treatment were reviewed, and the patient expressed understanding.  - Scheduled for Mohs on 11/09/23; will need pre-operative clearance by neurology for swallow syncope noted above  Return in 12 days (  on 11/09/2023).  I, Randy Boyle, Randy Boyle, am acting as scribe for Randy CHRISTELLA HOLY, MD.    Documentation: I have reviewed the above documentation for accuracy and completeness, and I agree with the above.  Randy CHRISTELLA HOLY, MD

## 2023-10-28 NOTE — Patient Instructions (Addendum)
 To schedule your neurology appointment, please call [phone].  Important Information   Due to recent changes in healthcare laws, you may see results of your pathology and/or laboratory studies on MyChart before the doctors have had a chance to review them. We understand that in some cases there may be results that are confusing or concerning to you. Please understand that not all results are received at the same time and often the doctors may need to interpret multiple results in order to provide you with the best plan of care or course of treatment. Therefore, we ask that you please give us  2 business days to thoroughly review all your results before contacting the office for clarification. Should we see a critical lab result, you will be contacted sooner.     If You Need Anything After Your Visit   If you have any questions or concerns for your doctor, please call our main line at [phone]. If no one answers, please leave a voicemail as directed and we will return your call as soon as possible. Messages left after 4 pm will be answered the following business day.    You may also send us  a message via MyChart. We typically respond to MyChart messages within 1-2 business days.  For prescription refills, please ask your pharmacy to contact our office. Our fax number is [phone].  If you have an urgent issue when the clinic is closed that cannot wait until the next business day, you can page your doctor at the number below.     Please note that while we do our best to be available for urgent issues outside of office hours, we are not available 24/7.    If you have an urgent issue and are unable to reach us , you may choose to seek medical care at your doctor's office, retail clinic, urgent care center, or emergency room.   If you have a medical emergency, please immediately call 911 or go to the emergency department. In the event of inclement weather, please call our main line at  [phone] for an update on the status of any delays or closures.  Dermatology Medication Tips: Please keep the boxes that topical medications come in in order to help keep track of the instructions about where and how to use these. Pharmacies typically print the medication instructions only on the boxes and not directly on the medication tubes.   If your medication is too expensive, please contact our office at [phone] or send us  a message through MyChart.    We are unable to tell what your co-pay for medications will be in advance as this is different depending on your insurance coverage. However, we may be able to find a substitute medication at lower cost or fill out paperwork to get insurance to cover a needed medication.    If a prior authorization is required to get your medication covered by your insurance company, please allow us  1-2 business days to complete this process.   Drug prices often vary depending on where the prescription is filled and some pharmacies may offer cheaper prices.   The website www.goodrx.com contains coupons for medications through different pharmacies. The prices here do not account for what the cost may be with help from insurance (it may be cheaper with your insurance), but the website can give you the price if you did not use any insurance.  - You can print the associated coupon and take it with your prescription to the pharmacy.  -  You may also stop by our office during regular business hours and pick up a GoodRx coupon card.  - If you need your prescription sent electronically to a different pharmacy, notify our office through [facility] or by phone at [phone]

## 2023-11-01 ENCOUNTER — Other Ambulatory Visit: Payer: Self-pay | Admitting: Dermatology

## 2023-11-01 DIAGNOSIS — R55 Syncope and collapse: Secondary | ICD-10-CM

## 2023-11-02 ENCOUNTER — Telehealth: Payer: Self-pay | Admitting: Dermatology

## 2023-11-02 NOTE — Telephone Encounter (Signed)
Called and spoke to both pt and his wife and let them know that we sent referrals to both cardiology and neurology.  I also let them know we need for at least one of them if not both to clear him before we are able to perform his Mohs next Tuesday.

## 2023-11-02 NOTE — Telephone Encounter (Signed)
-----   Message from P[place] P sent at 11/01/2023  2:14 PM EST ----- Regarding: Referring to a Cardiologist Good afternoon,  Patient wife called in to advised that they called the Neurologist as referred but they were told that it seems like they should've been referred to a Cardiologist instead. So wife is asking to see if someone can reach out to her in regards to this matter.   [phone] Junious Dresser (wife)-  [phone]  Thank you, MO

## 2023-11-04 ENCOUNTER — Encounter: Payer: Self-pay | Admitting: Neurology

## 2023-11-04 ENCOUNTER — Other Ambulatory Visit (HOSPITAL_COMMUNITY): Payer: Self-pay | Admitting: Physician Assistant

## 2023-11-04 ENCOUNTER — Encounter: Payer: Self-pay | Admitting: Physician Assistant

## 2023-11-04 DIAGNOSIS — D049 Carcinoma in situ of skin, unspecified: Secondary | ICD-10-CM

## 2023-11-04 DIAGNOSIS — C44329 Squamous cell carcinoma of skin of other parts of face: Secondary | ICD-10-CM

## 2023-11-05 ENCOUNTER — Ambulatory Visit: Payer: PPO | Attending: Internal Medicine | Admitting: Internal Medicine

## 2023-11-09 ENCOUNTER — Encounter (HOSPITAL_COMMUNITY): Payer: Self-pay

## 2023-11-09 ENCOUNTER — Ambulatory Visit (HOSPITAL_COMMUNITY)
Admission: RE | Admit: 2023-11-09 | Discharge: 2023-11-09 | Disposition: A | Discharge status: Home / Self Care | Payer: PPO | Source: Ambulatory Visit | Attending: Physician Assistant | Admitting: Physician Assistant

## 2023-11-09 ENCOUNTER — Encounter: Payer: PPO | Admitting: Dermatology

## 2023-11-09 DIAGNOSIS — R930 Abnormal findings on diagnostic imaging of skull and head, not elsewhere classified: Secondary | ICD-10-CM | POA: Insufficient documentation

## 2023-11-09 DIAGNOSIS — I700 Atherosclerosis of aorta: Secondary | ICD-10-CM | POA: Diagnosis not present

## 2023-11-09 DIAGNOSIS — C4490 Unspecified malignant neoplasm of skin, unspecified: Secondary | ICD-10-CM | POA: Diagnosis not present

## 2023-11-09 DIAGNOSIS — M7989 Other specified soft tissue disorders: Secondary | ICD-10-CM | POA: Diagnosis not present

## 2023-11-09 DIAGNOSIS — D049 Carcinoma in situ of skin, unspecified: Secondary | ICD-10-CM | POA: Insufficient documentation

## 2023-11-09 DIAGNOSIS — D0439 Carcinoma in situ of skin of other parts of face: Secondary | ICD-10-CM | POA: Insufficient documentation

## 2023-11-09 DIAGNOSIS — I6522 Occlusion and stenosis of left carotid artery: Secondary | ICD-10-CM | POA: Diagnosis not present

## 2023-11-09 MED ORDER — IOHEXOL 350 MG/ML SOLN
75.0000 mL | Freq: Once | INTRAVENOUS | Status: AC | PRN
  Administered 2023-11-09: 75 mL via INTRAVENOUS

## 2023-11-24 NOTE — Progress Notes (Signed)
 Histology and Location of Primary Skin Cancer:   Basal Cell Carcinoma of Skin of Face located in Left Preauricular Region-Large Pink Plaque with Central Areas of Ulceration.  Randy Boyle presented with the following signs/symptoms,     Patient reports redness and swelling on the left side of face. Patient also reports some oozing he experienced initially. Skin Biopsy:   Past/Anticipated interventions by patient's surgeon/dermatologist for current problematic lesion, if any:  Karina, MD 10/21/2023 Patient currently undergoing topical therapy with imiquimod for superficial [facility]  Mohs Surgery for [facility] repaired with advancement flap treated on 10/21/2023   History of Blistering sunburns, if any:  History of sunburns and sun exposure   SAFETY ISSUES: Prior radiation? None Pacemaker/ICD? None Possible current pregnancy? N/A Is the patient on methotrexate? N/A  Current Complaints / other details:   None  BP 139/76 (BP Location: Left Arm, Patient Position: Sitting)   Pulse 81   Temp (!) 97.1 F (36.2 C) (Temporal)   Resp 18   Ht 5' 8.5" (1.74 m)   Wt 192 lb (87.1 kg)   SpO2 98%   BMI 28.77 kg/m   Wt Readings from Last 3 Encounters:  12/07/23 192 lb (87.1 kg)  08/23/23 198 lb (89.8 kg)  05/10/23 194 lb (88 kg)

## 2023-12-06 NOTE — Progress Notes (Signed)
 Radiation Oncology         [phone] ________________________________  Initial outpatient Consultation  Name: Randy Boyle MRN: [id number]  Date: 12/07/2023  DOB: November 03, 1950  WU:[id number], Karleen Hampshire, MD  Roosevelt Locks, MD   REFERRING PHYSICIAN: Roosevelt Locks, MD  DIAGNOSIS: No diagnosis found.   Cancer Staging  No matching staging information was found for the patient.   CHIEF COMPLAINT: Here to discuss management of skin cancer  HISTORY OF PRESENT ILLNESS::Randy Boyle is a 73 y.o. male with a history of basal cell carcinoma in the left forehead region diagnosed in June of 2021. Patient presented to his dermatologist, Dr. Katrinka Blazing on 09-19-24 with complains of several lesions and spots with accompanying tingling and scratching across his jaw and cheek.    Subsequently, he underwent a tangential skin biopsy on 10-05-23 under the care of Dr. Katrinka Blazing. Left inferior auricular biopsy indicated superficial basal cell carcinoma. Left nasolabial fold biopsy indicated basal cell carcinoma with nodular pattern.   In light of findings, he was referred to Dr. Milda Smart on 10-21-23 for a Mohs of a nodular BCC on the left cheek. At that, he underwent a Mohs surgery of his left nasolabial fold. Surgical biopsy of left preauricular showed an infiltrative pattern basal cell carcinoma. BCC was then treated with a linear repair at that time.   On a follow up with Dr.Paci on 10-28-23, he reported recovering well from procedure with mild discomfort. He also stated that he'd experienced several instances of "collapsing" when eating or drinking about 24 hours after the procedure. As a result, he underwent a CT head on 11-09-23 showing no acute intracranial abnormality or metastatic disease Identified but did indicate an enlarged  pituitary gland with no regional mass effect or Invasion. CT soft tissue neck on the same date showed ndistinct asymmetric left infraorbital, left nasolabial fold skin thickening and  subcutaneous stranding, nonspecific but compatible with skin cancer, in an area of up to 2 cm. No underlying bony changes or orbital involvement were indicated.    PREVIOUS RADIATION THERAPY: No  PAST MEDICAL HISTORY:  has a past medical history of Basal cell carcinoma (03/04/2020), BCC (basal cell carcinoma of skin) (10/05/2023), BCC (basal cell carcinoma of skin) (10/05/2023), Diverticulitis, HLD (hyperlipidemia), HTN (hypertension), pancreatitis, and Personal history of colon cancer, stage II.    PAST SURGICAL HISTORY: Past Surgical History:  Procedure Laterality Date   COLONOSCOPY     HEMICOLECTOMY  07/2006    FAMILY HISTORY: family history is not on file.  SOCIAL HISTORY:  reports that he has never smoked. He has never used smokeless tobacco. He reports current alcohol use. He reports that he does not use drugs.  ALLERGIES: Oxycodone-acetaminophen and Penicillins  MEDICATIONS:  Current Outpatient Medications  Medication Sig Dispense Refill   desonide (DESOWEN) 0.05 % cream Apply topically 2 (two) times daily. Apply to red inflamed areas until smooth and redness is gone 15 g 0   fenofibrate 160 MG tablet Take 1 tablet (160 mg total) by mouth daily. 90 tablet 3   FLUoxetine (PROZAC) 20 MG capsule Take 1 capsule (20 mg total) by mouth daily. 90 capsule 3   losartan (COZAAR) 50 MG tablet Take 1 tablet (50 mg total) by mouth daily. 90 tablet 3   Multiple Vitamins-Minerals (MULTIVITAMIN GUMMIES ADULTS PO) Take by mouth daily.     rosuvastatin (CRESTOR) 10 MG tablet Take 1 tablet (10 mg total) by mouth daily. 90 tablet 3   traMADol (ULTRAM) 50 MG  tablet Take 1 tablet (50 mg total) by mouth every 6 (six) hours as needed for up to 10 doses. 10 tablet 0   No current facility-administered medications for this encounter.    REVIEW OF SYSTEMS:  Notable for that above.   PHYSICAL EXAM:  vitals were not taken for this visit.   General: Alert and oriented, in no acute distress *** HEENT:  Head is normocephalic. Extraocular movements are intact. Oropharynx is clear. Neck: Neck is supple, no palpable cervical or supraclavicular lymphadenopathy. Heart: Regular in rate and rhythm with no murmurs, rubs, or gallops. Chest: Clear to auscultation bilaterally, with no rhonchi, wheezes, or rales. Abdomen: Soft, nontender, nondistended, with no rigidity or guarding. Extremities: No cyanosis or edema. Lymphatics: see Neck Exam Skin: No concerning lesions. Musculoskeletal: symmetric strength and muscle tone throughout. Neurologic: Cranial nerves II through XII are grossly intact. No obvious focalities. Speech is fluent. Coordination is intact. Psychiatric: Judgment and insight are intact. Affect is appropriate.   ECOG = ***  0 - Asymptomatic (Fully active, able to carry on all predisease activities without restriction)  1 - Symptomatic but completely ambulatory (Restricted in physically strenuous activity but ambulatory and able to carry out work of a light or sedentary nature. For example, light housework, office work)  2 - Symptomatic, <50% in bed during the day (Ambulatory and capable of all self care but unable to carry out any work activities. Up and about more than 50% of waking hours)  3 - Symptomatic, >50% in bed, but not bedbound (Capable of only limited self-care, confined to bed or chair 50% or more of waking hours)  4 - Bedbound (Completely disabled. Cannot carry on any self-care. Totally confined to bed or chair)  5 - Death   Santiago Glad MM, Creech RH, Tormey DC, et al. [phone]). "Toxicity and response criteria of the [facility]". Am. Evlyn Clines. Oncol. 5 (6): 649-55   LABORATORY DATA:  Lab Results  Component Value Date   WBC 5.2 08/16/2023   HGB 14.4 08/16/2023   HCT 43.7 08/16/2023   MCV 86.6 08/16/2023   PLT 267.0 08/16/2023   CMP     Component Value Date/Time   NA 139 08/16/2023 1558   K 4.3 08/16/2023 1558   CL 103 08/16/2023 1558   CO2 27  08/16/2023 1558   GLUCOSE 100 (H) 08/16/2023 1558   BUN 18 08/16/2023 1558   CREATININE 1.25 08/16/2023 1558   CALCIUM 10.1 08/16/2023 1558   PROT 7.3 08/16/2023 1558   ALBUMIN 4.8 08/16/2023 1558   AST 25 08/16/2023 1558   ALT 24 08/16/2023 1558   ALKPHOS 43 08/16/2023 1558   BILITOT 0.4 08/16/2023 1558   GFR 56.80 (L) 08/16/2023 1558   GFRNONAA 80.83 12/17/2009 0822         RADIOGRAPHY: CT SOFT TISSUE NECK W CONTRAST Result Date: 11/18/2023 CLINICAL DATA:  73 year old male with skin cancer. EXAM: CT NECK WITH CONTRAST TECHNIQUE: Multidetector CT imaging of the neck was performed using the standard protocol following the bolus administration of intravenous contrast. RADIATION DOSE REDUCTION: This exam was performed according to the departmental dose-optimization program which includes automated exposure control, adjustment of the mA and/or kV according to patient size and/or use of iterative reconstruction technique. CONTRAST:  75mL OMNIPAQUE IOHEXOL 350 MG/ML SOLN COMPARISON:  Head CT the same day reported separately. FINDINGS: Pharynx and larynx: Larynx and pharynx soft tissue contours are within normal limits. Benign postinflammatory calcifications of the palatine tonsils. Parapharyngeal and retropharyngeal spaces  appear negative. Salivary glands: Within normal limits.  Negative sublingual space. Thyroid: Diminutive, negative. Lymph nodes: Negative.  No cervical lymphadenopathy. Vascular: Major vascular structures in the neck and at the skull base are enhancing and appear to be patent. Bulky calcified atherosclerosis at the left ICA origin, with appearance of possible hemodynamically significant stenosis on series 6, image 65. Limited intracranial: Head CT today reported separately. Visualized orbits: Orbits soft tissues better included on the head CT today, with symmetric globes and intraorbital soft tissues, within normal limits. Left infraorbital asymmetric skin thickening (series 7, image 7  of the comparison and series 4, image 1 of this exam) is associated with indistinct subjacent subcutaneous stranding in an area of about 2 cm on series 4, image 7. Asymmetric and nodular soft tissue thickening continues into the left nasal labial fold on series 4, image 17, in an area of about 9 mm. No soft tissue gas. Mastoids and visualized paranasal sinuses: Reported separately today. Skeleton: Absent dentition. Ordinary cervical spine degeneration. No acute or suspicious osseous lesion identified. Upper chest: Calcified aortic atherosclerosis. Negative visible upper lungs aside from dependent atelectasis. No superior mediastinal lymphadenopathy. IMPRESSION: 1. Indistinct asymmetric left infraorbital, left nasolabial fold skin thickening and subcutaneous stranding, nonspecific but compatible with skin cancer, in an area of up to 2 cm. See series 4 images 7 and 17 of this exam. 2. No underlying bony changes. No direct orbital involvement. No cervical lymphadenopathy. 3. Aortic Atherosclerosis (ICD10-I70.0). Calcified carotid atherosclerosis which might be hemodynamically significant on the left. Carotid Doppler ultrasound may be valuable. 4. Head CT reported separately today. Electronically Signed   By: Odessa Fleming M.D.   On: 11/18/2023 08:41   CT HEAD W & WO CONTRAST ( ) Result Date: 11/18/2023 CLINICAL DATA:  73 year old male with skin cancer. EXAM: CT HEAD WITHOUT AND WITH CONTRAST TECHNIQUE: Contiguous axial images were obtained from the base of the skull through the vertex without and with intravenous contrast. RADIATION DOSE REDUCTION: This exam was performed according to the departmental dose-optimization program which includes automated exposure control, adjustment of the mA and/or kV according to patient size and/or use of iterative reconstruction technique. CONTRAST:  75mL OMNIPAQUE IOHEXOL 350 MG/ML SOLN COMPARISON:  Neck CT the same day reported separately. FINDINGS: Brain: Cerebral volume is within  normal limits for age. No midline shift, ventriculomegaly, mass effect, evidence of mass lesion, intracranial hemorrhage or evidence of cortically based acute infarction. Patchy, moderate scattered bilateral cerebral white matter hypodensity. Deep white matter capsule involvement. Hypodensity in the right lentiform nuclei. Pituitary is mildly enlarged, 10-11 mm (8 mm is upper limits of normal for this age and gender). No regional mass effect or invasion identified. No abnormal enhancement identified. Vascular: Major intracranial vascular structures are enhancing as expected. Calcified atherosclerosis at the skull base. Skull: Intact, negative. Sinuses/Orbits: Visualized paranasal sinuses and mastoids are clear. Other: Asymmetric left face, left nasolabial skin and subcutaneous soft tissue density. See neck CT reported separately. Other visible scalp soft tissues within normal limits. IMPRESSION: 1. No acute intracranial abnormality or metastatic disease identified. 2. Mildly enlarged pituitary gland with no regional mass effect or invasion. No further imaging evaluation or follow-up is necessary. Consider endocrine function tests and correlate for history of pituitary hypersecretion. This follows ACR consensus guidelines: Management of Incidental Pituitary Findings on CT, MRI and F18-FDG PET: A White Paper of the ACR Incidental Findings Committee. J Am Coll Radiol 2018; 15: 846-96. 3. Moderate for age changes in the cerebral white matter and deep  gray nuclei compatible with small vessel disease. 4. Asymmetric left face, left nasolabial skin and subcutaneous soft tissues. See Neck CT reported separately today. Electronically Signed   By: Odessa Fleming M.D.   On: 11/18/2023 08:34      IMPRESSION/PLAN:***    On date of service, in total, I spent *** minutes on this encounter. Patient was seen in person.   __________________________________________   Lonie Peak, MD  This document serves as a record of services  personally performed by Lonie Peak, MD. It was created on her behalf by Herbie Saxon, a trained medical scribe. The creation of this record is based on the scribe's personal observations and the provider's statements to them. This document has been checked and approved by the attending provider.

## 2023-12-07 ENCOUNTER — Ambulatory Visit
Admission: RE | Admit: 2023-12-07 | Discharge: 2023-12-07 | Disposition: A | Discharge status: Home / Self Care | Source: Ambulatory Visit | Attending: Radiation Oncology | Admitting: Radiation Oncology

## 2023-12-07 ENCOUNTER — Encounter: Payer: Self-pay | Admitting: Radiation Oncology

## 2023-12-07 VITALS — BP 139/76 | HR 81 | Temp 97.1°F | Resp 18 | Ht 68.5 in | Wt 192.0 lb

## 2023-12-07 DIAGNOSIS — C44319 Basal cell carcinoma of skin of other parts of face: Secondary | ICD-10-CM

## 2023-12-07 NOTE — Progress Notes (Signed)
 Oncology Nurse Navigator Documentation   Met with patient and his wife before his initial consult with Dr. Basilio Cairo.   I introduced myself as his/their Navigator, explained my role as a member of the Care Team and provided him with my direct contact information.  They verbalized understanding of information provided. I encouraged them to call with questions/concerns moving forward.  Hedda Slade, RN, BSN, OCN Head & Neck Oncology Nurse Navigator [facility] [phone]

## 2023-12-13 ENCOUNTER — Telehealth: Payer: Self-pay | Admitting: *Deleted

## 2023-12-13 NOTE — Telephone Encounter (Signed)
 Copied from CRM [id number]. Topic: Clinical - Medication Refill >> Dec 13, 2023 10:42 AM Sim Boast F wrote: Patient called to request that all refills be sent to [facility] - [place], [place] - [address] - I updated in patients chart

## 2023-12-13 NOTE — Telephone Encounter (Signed)
 Spoke with Mr. Randy Boyle to let him know we have updated his pharmacy.  He was able to have his refills all transferred over so nothing further is needed at the time.

## 2023-12-14 ENCOUNTER — Ambulatory Visit
Admission: RE | Admit: 2023-12-14 | Discharge: 2023-12-14 | Disposition: A | Discharge status: Home / Self Care | Source: Ambulatory Visit | Attending: Radiation Oncology | Admitting: Radiation Oncology

## 2023-12-14 DIAGNOSIS — C44319 Basal cell carcinoma of skin of other parts of face: Secondary | ICD-10-CM | POA: Diagnosis not present

## 2023-12-21 ENCOUNTER — Ambulatory Visit
Admission: RE | Admit: 2023-12-21 | Discharge: 2023-12-21 | Disposition: A | Discharge status: Home / Self Care | Source: Ambulatory Visit | Attending: Radiation Oncology | Admitting: Radiation Oncology

## 2023-12-21 DIAGNOSIS — C44319 Basal cell carcinoma of skin of other parts of face: Secondary | ICD-10-CM | POA: Insufficient documentation

## 2023-12-22 ENCOUNTER — Ambulatory Visit: Admitting: Radiation Oncology

## 2023-12-22 DIAGNOSIS — C44319 Basal cell carcinoma of skin of other parts of face: Secondary | ICD-10-CM | POA: Diagnosis not present

## 2023-12-23 ENCOUNTER — Ambulatory Visit: Admission: RE | Admit: 2023-12-23 | Source: Ambulatory Visit | Admitting: Radiation Oncology

## 2023-12-23 ENCOUNTER — Ambulatory Visit

## 2023-12-24 ENCOUNTER — Ambulatory Visit

## 2023-12-24 DIAGNOSIS — C44319 Basal cell carcinoma of skin of other parts of face: Secondary | ICD-10-CM | POA: Diagnosis not present

## 2023-12-27 ENCOUNTER — Ambulatory Visit
Admission: RE | Admit: 2023-12-27 | Discharge: 2023-12-27 | Disposition: A | Discharge status: Home / Self Care | Source: Ambulatory Visit | Attending: Radiation Oncology | Admitting: Radiation Oncology

## 2023-12-27 ENCOUNTER — Other Ambulatory Visit: Payer: Self-pay

## 2023-12-27 ENCOUNTER — Ambulatory Visit
Admission: RE | Admit: 2023-12-27 | Discharge: 2023-12-27 | Disposition: A | Discharge status: Home / Self Care | Source: Ambulatory Visit | Attending: Radiation Oncology

## 2023-12-27 DIAGNOSIS — Z510 Encounter for antineoplastic radiation therapy: Secondary | ICD-10-CM | POA: Diagnosis not present

## 2023-12-27 DIAGNOSIS — C44319 Basal cell carcinoma of skin of other parts of face: Secondary | ICD-10-CM | POA: Diagnosis not present

## 2023-12-27 LAB — RAD ONC ARIA SESSION SUMMARY
Course Elapsed Days: 0
Plan Fractions Treated to Date: 1
Plan Prescribed Dose Per Fraction: 2.5 Gy
Plan Total Fractions Prescribed: 20
Plan Total Prescribed Dose: 50 Gy
Reference Point Dosage Given to Date: 2.5 Gy
Reference Point Session Dosage Given: 2.5 Gy
Session Number: 1

## 2023-12-28 ENCOUNTER — Other Ambulatory Visit: Payer: Self-pay

## 2023-12-28 ENCOUNTER — Ambulatory Visit
Admission: RE | Admit: 2023-12-28 | Discharge: 2023-12-28 | Disposition: A | Discharge status: Home / Self Care | Source: Ambulatory Visit | Attending: Radiation Oncology

## 2023-12-28 DIAGNOSIS — C44319 Basal cell carcinoma of skin of other parts of face: Secondary | ICD-10-CM | POA: Diagnosis not present

## 2023-12-28 LAB — RAD ONC ARIA SESSION SUMMARY
Course Elapsed Days: 1
Plan Fractions Treated to Date: 2
Plan Prescribed Dose Per Fraction: 2.5 Gy
Plan Total Fractions Prescribed: 20
Plan Total Prescribed Dose: 50 Gy
Reference Point Dosage Given to Date: 5 Gy
Reference Point Session Dosage Given: 2.5 Gy
Session Number: 2

## 2023-12-29 ENCOUNTER — Other Ambulatory Visit: Payer: Self-pay

## 2023-12-29 ENCOUNTER — Ambulatory Visit
Admission: RE | Admit: 2023-12-29 | Discharge: 2023-12-29 | Disposition: A | Discharge status: Home / Self Care | Source: Ambulatory Visit | Attending: Radiation Oncology

## 2023-12-29 DIAGNOSIS — C44319 Basal cell carcinoma of skin of other parts of face: Secondary | ICD-10-CM | POA: Diagnosis not present

## 2023-12-29 LAB — RAD ONC ARIA SESSION SUMMARY
Course Elapsed Days: 2
Plan Fractions Treated to Date: 3
Plan Prescribed Dose Per Fraction: 2.5 Gy
Plan Total Fractions Prescribed: 20
Plan Total Prescribed Dose: 50 Gy
Reference Point Dosage Given to Date: 7.5 Gy
Reference Point Session Dosage Given: 2.5 Gy
Session Number: 3

## 2023-12-30 ENCOUNTER — Other Ambulatory Visit: Payer: Self-pay

## 2023-12-30 ENCOUNTER — Ambulatory Visit
Admission: RE | Admit: 2023-12-30 | Discharge: 2023-12-30 | Discharge status: Home / Self Care | Source: Ambulatory Visit | Attending: Radiation Oncology

## 2023-12-30 DIAGNOSIS — C44319 Basal cell carcinoma of skin of other parts of face: Secondary | ICD-10-CM | POA: Diagnosis not present

## 2023-12-30 LAB — RAD ONC ARIA SESSION SUMMARY
Course Elapsed Days: 3
Plan Fractions Treated to Date: 4
Plan Prescribed Dose Per Fraction: 2.5 Gy
Plan Total Fractions Prescribed: 20
Plan Total Prescribed Dose: 50 Gy
Reference Point Dosage Given to Date: 10 Gy
Reference Point Session Dosage Given: 2.5 Gy
Session Number: 4

## 2023-12-31 ENCOUNTER — Ambulatory Visit: Payer: PPO | Admitting: Internal Medicine

## 2023-12-31 ENCOUNTER — Other Ambulatory Visit: Payer: Self-pay

## 2023-12-31 ENCOUNTER — Ambulatory Visit
Admission: RE | Admit: 2023-12-31 | Discharge: 2023-12-31 | Disposition: A | Discharge status: Home / Self Care | Source: Ambulatory Visit | Attending: Radiation Oncology | Admitting: Radiation Oncology

## 2023-12-31 DIAGNOSIS — C44319 Basal cell carcinoma of skin of other parts of face: Secondary | ICD-10-CM | POA: Diagnosis not present

## 2023-12-31 DIAGNOSIS — Z510 Encounter for antineoplastic radiation therapy: Secondary | ICD-10-CM | POA: Diagnosis not present

## 2023-12-31 LAB — RAD ONC ARIA SESSION SUMMARY
Course Elapsed Days: 4
Plan Fractions Treated to Date: 5
Plan Prescribed Dose Per Fraction: 2.5 Gy
Plan Total Fractions Prescribed: 20
Plan Total Prescribed Dose: 50 Gy
Reference Point Dosage Given to Date: 12.5 Gy
Reference Point Session Dosage Given: 2.5 Gy
Session Number: 5

## 2024-01-03 ENCOUNTER — Ambulatory Visit
Admission: RE | Admit: 2024-01-03 | Discharge: 2024-01-03 | Disposition: A | Discharge status: Home / Self Care | Source: Ambulatory Visit | Attending: Radiation Oncology | Admitting: Radiation Oncology

## 2024-01-03 ENCOUNTER — Other Ambulatory Visit: Payer: Self-pay

## 2024-01-03 DIAGNOSIS — C44319 Basal cell carcinoma of skin of other parts of face: Secondary | ICD-10-CM | POA: Diagnosis not present

## 2024-01-03 LAB — RAD ONC ARIA SESSION SUMMARY
Course Elapsed Days: 7
Plan Fractions Treated to Date: 6
Plan Prescribed Dose Per Fraction: 2.5 Gy
Plan Total Fractions Prescribed: 20
Plan Total Prescribed Dose: 50 Gy
Reference Point Dosage Given to Date: 15 Gy
Reference Point Session Dosage Given: 2.5 Gy
Session Number: 6

## 2024-01-04 ENCOUNTER — Other Ambulatory Visit: Payer: Self-pay

## 2024-01-04 ENCOUNTER — Ambulatory Visit
Admission: RE | Admit: 2024-01-04 | Discharge: 2024-01-04 | Disposition: A | Discharge status: Home / Self Care | Source: Ambulatory Visit | Attending: Radiation Oncology | Admitting: Radiation Oncology

## 2024-01-04 DIAGNOSIS — C44319 Basal cell carcinoma of skin of other parts of face: Secondary | ICD-10-CM | POA: Diagnosis not present

## 2024-01-04 LAB — RAD ONC ARIA SESSION SUMMARY
Course Elapsed Days: 8
Plan Fractions Treated to Date: 7
Plan Prescribed Dose Per Fraction: 2.5 Gy
Plan Total Fractions Prescribed: 20
Plan Total Prescribed Dose: 50 Gy
Reference Point Dosage Given to Date: 17.5 Gy
Reference Point Session Dosage Given: 2.5 Gy
Session Number: 7

## 2024-01-05 ENCOUNTER — Other Ambulatory Visit: Payer: Self-pay

## 2024-01-05 ENCOUNTER — Ambulatory Visit
Admission: RE | Admit: 2024-01-05 | Discharge: 2024-01-05 | Disposition: A | Discharge status: Home / Self Care | Source: Ambulatory Visit | Attending: Radiation Oncology | Admitting: Radiation Oncology

## 2024-01-05 DIAGNOSIS — C44319 Basal cell carcinoma of skin of other parts of face: Secondary | ICD-10-CM | POA: Diagnosis not present

## 2024-01-05 LAB — RAD ONC ARIA SESSION SUMMARY
Course Elapsed Days: 9
Plan Fractions Treated to Date: 8
Plan Prescribed Dose Per Fraction: 2.5 Gy
Plan Total Fractions Prescribed: 20
Plan Total Prescribed Dose: 50 Gy
Reference Point Dosage Given to Date: 20 Gy
Reference Point Session Dosage Given: 2.5 Gy
Session Number: 8

## 2024-01-06 ENCOUNTER — Other Ambulatory Visit: Payer: Self-pay

## 2024-01-06 ENCOUNTER — Ambulatory Visit
Admission: RE | Admit: 2024-01-06 | Discharge: 2024-01-06 | Disposition: A | Discharge status: Home / Self Care | Source: Ambulatory Visit | Attending: Radiation Oncology | Admitting: Radiation Oncology

## 2024-01-06 DIAGNOSIS — C44319 Basal cell carcinoma of skin of other parts of face: Secondary | ICD-10-CM | POA: Diagnosis not present

## 2024-01-06 LAB — RAD ONC ARIA SESSION SUMMARY
Course Elapsed Days: 10
Plan Fractions Treated to Date: 9
Plan Prescribed Dose Per Fraction: 2.5 Gy
Plan Total Fractions Prescribed: 20
Plan Total Prescribed Dose: 50 Gy
Reference Point Dosage Given to Date: 22.5 Gy
Reference Point Session Dosage Given: 2.5 Gy
Session Number: 9

## 2024-01-07 ENCOUNTER — Ambulatory Visit
Admission: RE | Admit: 2024-01-07 | Discharge: 2024-01-07 | Disposition: A | Discharge status: Home / Self Care | Source: Ambulatory Visit | Attending: Radiation Oncology | Admitting: Radiation Oncology

## 2024-01-07 ENCOUNTER — Other Ambulatory Visit: Payer: Self-pay

## 2024-01-07 DIAGNOSIS — Z510 Encounter for antineoplastic radiation therapy: Secondary | ICD-10-CM | POA: Diagnosis not present

## 2024-01-07 DIAGNOSIS — C44319 Basal cell carcinoma of skin of other parts of face: Secondary | ICD-10-CM | POA: Diagnosis not present

## 2024-01-07 LAB — RAD ONC ARIA SESSION SUMMARY
Course Elapsed Days: 11
Plan Fractions Treated to Date: 10
Plan Prescribed Dose Per Fraction: 2.5 Gy
Plan Total Fractions Prescribed: 20
Plan Total Prescribed Dose: 50 Gy
Reference Point Dosage Given to Date: 25 Gy
Reference Point Session Dosage Given: 2.5 Gy
Session Number: 10

## 2024-01-10 ENCOUNTER — Other Ambulatory Visit: Payer: Self-pay

## 2024-01-10 ENCOUNTER — Ambulatory Visit
Admission: RE | Admit: 2024-01-10 | Discharge: 2024-01-10 | Disposition: A | Discharge status: Home / Self Care | Source: Ambulatory Visit | Attending: Radiation Oncology | Admitting: Radiation Oncology

## 2024-01-10 ENCOUNTER — Ambulatory Visit: Payer: PPO | Admitting: Dermatology

## 2024-01-10 DIAGNOSIS — C44319 Basal cell carcinoma of skin of other parts of face: Secondary | ICD-10-CM | POA: Diagnosis not present

## 2024-01-10 LAB — RAD ONC ARIA SESSION SUMMARY
Course Elapsed Days: 14
Plan Fractions Treated to Date: 11
Plan Prescribed Dose Per Fraction: 2.5 Gy
Plan Total Fractions Prescribed: 20
Plan Total Prescribed Dose: 50 Gy
Reference Point Dosage Given to Date: 27.5 Gy
Reference Point Session Dosage Given: 2.5 Gy
Session Number: 11

## 2024-01-11 ENCOUNTER — Other Ambulatory Visit: Payer: Self-pay

## 2024-01-11 ENCOUNTER — Ambulatory Visit
Admission: RE | Admit: 2024-01-11 | Discharge: 2024-01-11 | Disposition: A | Discharge status: Home / Self Care | Source: Ambulatory Visit | Attending: Radiation Oncology | Admitting: Radiation Oncology

## 2024-01-11 DIAGNOSIS — C44319 Basal cell carcinoma of skin of other parts of face: Secondary | ICD-10-CM | POA: Diagnosis not present

## 2024-01-11 LAB — RAD ONC ARIA SESSION SUMMARY
Course Elapsed Days: 15
Plan Fractions Treated to Date: 12
Plan Prescribed Dose Per Fraction: 2.5 Gy
Plan Total Fractions Prescribed: 20
Plan Total Prescribed Dose: 50 Gy
Reference Point Dosage Given to Date: 30 Gy
Reference Point Session Dosage Given: 2.5 Gy
Session Number: 12

## 2024-01-12 ENCOUNTER — Other Ambulatory Visit: Payer: Self-pay

## 2024-01-12 ENCOUNTER — Ambulatory Visit
Admission: RE | Admit: 2024-01-12 | Discharge: 2024-01-12 | Disposition: A | Discharge status: Home / Self Care | Source: Ambulatory Visit | Attending: Radiation Oncology | Admitting: Radiation Oncology

## 2024-01-12 DIAGNOSIS — C44319 Basal cell carcinoma of skin of other parts of face: Secondary | ICD-10-CM | POA: Diagnosis not present

## 2024-01-12 LAB — RAD ONC ARIA SESSION SUMMARY
Course Elapsed Days: 16
Plan Fractions Treated to Date: 13
Plan Prescribed Dose Per Fraction: 2.5 Gy
Plan Total Fractions Prescribed: 20
Plan Total Prescribed Dose: 50 Gy
Reference Point Dosage Given to Date: 32.5 Gy
Reference Point Session Dosage Given: 2.5 Gy
Session Number: 13

## 2024-01-13 ENCOUNTER — Other Ambulatory Visit: Payer: Self-pay

## 2024-01-13 ENCOUNTER — Ambulatory Visit
Admission: RE | Admit: 2024-01-13 | Discharge: 2024-01-13 | Disposition: A | Discharge status: Home / Self Care | Source: Ambulatory Visit | Attending: Radiation Oncology

## 2024-01-13 DIAGNOSIS — C44319 Basal cell carcinoma of skin of other parts of face: Secondary | ICD-10-CM | POA: Diagnosis not present

## 2024-01-13 LAB — RAD ONC ARIA SESSION SUMMARY
Course Elapsed Days: 17
Plan Fractions Treated to Date: 14
Plan Prescribed Dose Per Fraction: 2.5 Gy
Plan Total Fractions Prescribed: 20
Plan Total Prescribed Dose: 50 Gy
Reference Point Dosage Given to Date: 35 Gy
Reference Point Session Dosage Given: 2.5 Gy
Session Number: 14

## 2024-01-14 ENCOUNTER — Ambulatory Visit
Admission: RE | Admit: 2024-01-14 | Discharge: 2024-01-14 | Disposition: A | Discharge status: Home / Self Care | Source: Ambulatory Visit | Attending: Radiation Oncology | Admitting: Radiation Oncology

## 2024-01-14 ENCOUNTER — Other Ambulatory Visit: Payer: Self-pay

## 2024-01-14 DIAGNOSIS — Z510 Encounter for antineoplastic radiation therapy: Secondary | ICD-10-CM | POA: Diagnosis not present

## 2024-01-14 DIAGNOSIS — C44319 Basal cell carcinoma of skin of other parts of face: Secondary | ICD-10-CM | POA: Diagnosis not present

## 2024-01-14 LAB — RAD ONC ARIA SESSION SUMMARY
Course Elapsed Days: 18
Plan Fractions Treated to Date: 15
Plan Prescribed Dose Per Fraction: 2.5 Gy
Plan Total Fractions Prescribed: 20
Plan Total Prescribed Dose: 50 Gy
Reference Point Dosage Given to Date: 37.5 Gy
Reference Point Session Dosage Given: 2.5 Gy
Session Number: 15

## 2024-01-17 ENCOUNTER — Other Ambulatory Visit: Payer: Self-pay

## 2024-01-17 ENCOUNTER — Ambulatory Visit
Admission: RE | Admit: 2024-01-17 | Discharge: 2024-01-17 | Disposition: A | Discharge status: Home / Self Care | Source: Ambulatory Visit | Attending: Radiation Oncology

## 2024-01-17 DIAGNOSIS — C44319 Basal cell carcinoma of skin of other parts of face: Secondary | ICD-10-CM | POA: Diagnosis not present

## 2024-01-17 LAB — RAD ONC ARIA SESSION SUMMARY
Course Elapsed Days: 21
Plan Fractions Treated to Date: 16
Plan Prescribed Dose Per Fraction: 2.5 Gy
Plan Total Fractions Prescribed: 20
Plan Total Prescribed Dose: 50 Gy
Reference Point Dosage Given to Date: 40 Gy
Reference Point Session Dosage Given: 2.5 Gy
Session Number: 16

## 2024-01-18 ENCOUNTER — Ambulatory Visit

## 2024-01-18 ENCOUNTER — Ambulatory Visit
Admission: RE | Admit: 2024-01-18 | Discharge: 2024-01-18 | Disposition: A | Discharge status: Home / Self Care | Source: Ambulatory Visit | Attending: Radiation Oncology | Admitting: Radiation Oncology

## 2024-01-18 ENCOUNTER — Other Ambulatory Visit: Payer: Self-pay

## 2024-01-18 DIAGNOSIS — C44319 Basal cell carcinoma of skin of other parts of face: Secondary | ICD-10-CM | POA: Diagnosis not present

## 2024-01-18 LAB — RAD ONC ARIA SESSION SUMMARY
Course Elapsed Days: 22
Plan Fractions Treated to Date: 17
Plan Prescribed Dose Per Fraction: 2.5 Gy
Plan Total Fractions Prescribed: 20
Plan Total Prescribed Dose: 50 Gy
Reference Point Dosage Given to Date: 42.5 Gy
Reference Point Session Dosage Given: 2.5 Gy
Session Number: 17

## 2024-01-19 ENCOUNTER — Ambulatory Visit

## 2024-01-19 ENCOUNTER — Other Ambulatory Visit: Payer: Self-pay

## 2024-01-19 ENCOUNTER — Ambulatory Visit
Admission: RE | Admit: 2024-01-19 | Discharge: 2024-01-19 | Disposition: A | Discharge status: Home / Self Care | Source: Ambulatory Visit | Attending: Radiation Oncology

## 2024-01-19 DIAGNOSIS — C44319 Basal cell carcinoma of skin of other parts of face: Secondary | ICD-10-CM | POA: Diagnosis not present

## 2024-01-19 LAB — RAD ONC ARIA SESSION SUMMARY
Course Elapsed Days: 23
Plan Fractions Treated to Date: 18
Plan Prescribed Dose Per Fraction: 2.5 Gy
Plan Total Fractions Prescribed: 20
Plan Total Prescribed Dose: 50 Gy
Reference Point Dosage Given to Date: 45 Gy
Reference Point Session Dosage Given: 2.5 Gy
Session Number: 18

## 2024-01-20 ENCOUNTER — Other Ambulatory Visit: Payer: Self-pay

## 2024-01-20 ENCOUNTER — Ambulatory Visit
Admission: RE | Admit: 2024-01-20 | Discharge: 2024-01-20 | Disposition: A | Discharge status: Home / Self Care | Source: Ambulatory Visit | Attending: Radiation Oncology | Admitting: Radiation Oncology

## 2024-01-20 DIAGNOSIS — C44319 Basal cell carcinoma of skin of other parts of face: Secondary | ICD-10-CM | POA: Insufficient documentation

## 2024-01-20 LAB — RAD ONC ARIA SESSION SUMMARY
Course Elapsed Days: 24
Plan Fractions Treated to Date: 19
Plan Prescribed Dose Per Fraction: 2.5 Gy
Plan Total Fractions Prescribed: 20
Plan Total Prescribed Dose: 50 Gy
Reference Point Dosage Given to Date: 47.5 Gy
Reference Point Session Dosage Given: 2.5 Gy
Session Number: 19

## 2024-01-21 ENCOUNTER — Ambulatory Visit
Admission: RE | Admit: 2024-01-21 | Discharge: 2024-01-21 | Disposition: A | Discharge status: Home / Self Care | Source: Ambulatory Visit | Attending: Radiation Oncology | Admitting: Radiation Oncology

## 2024-01-21 ENCOUNTER — Other Ambulatory Visit: Payer: Self-pay

## 2024-01-21 DIAGNOSIS — C44319 Basal cell carcinoma of skin of other parts of face: Secondary | ICD-10-CM | POA: Diagnosis not present

## 2024-01-21 DIAGNOSIS — Z510 Encounter for antineoplastic radiation therapy: Secondary | ICD-10-CM | POA: Diagnosis not present

## 2024-01-21 LAB — RAD ONC ARIA SESSION SUMMARY
Course Elapsed Days: 25
Plan Fractions Treated to Date: 20
Plan Prescribed Dose Per Fraction: 2.5 Gy
Plan Total Fractions Prescribed: 20
Plan Total Prescribed Dose: 50 Gy
Reference Point Dosage Given to Date: 50 Gy
Reference Point Session Dosage Given: 2.5 Gy
Session Number: 20

## 2024-01-24 NOTE — Radiation Completion Notes (Signed)
 Patient Name: Randy Boyle, Randy Boyle MRN: [id number] Date of Birth: 1951-07-15 Referring Physician: Jade Maxon, M.D. Date of Service: 2024-01-24 Radiation Oncologist: Colie Dawes, M.D. [facility]                             RADIATION ONCOLOGY END OF TREATMENT NOTE     Diagnosis: C44.319 Basal cell carcinoma of skin of other parts of face Staging on 2023-12-07: Basal cell carcinoma (BCC) of left lateral cheek T=cT3, N=cN0, M=cM0 Intent: Curative     ==========DELIVERED PLANS==========  First Treatment Date: 2023-12-27 Last Treatment Date: 2024-01-21   Plan Name: [id number] Site: Cheek, Left Technique: Electron Mode: Electron Dose Per Fraction: 2.5 Gy Prescribed Dose (Delivered / Prescribed): 50 Gy / 50 Gy Prescribed Fxs (Delivered / Prescribed): 20 / 20     ==========ON TREATMENT VISIT DATES========== 2023-12-27, 2024-01-03, 2024-01-10, 2024-01-17     ==========UPCOMING VISITS==========       ==========APPENDIX - ON TREATMENT VISIT NOTES==========   See weekly On Treatment Notes in Epic for details in the Media tab (listed as Progress notes on the On Treatment Visit Dates listed above).

## 2024-02-25 NOTE — Progress Notes (Signed)
 Patient identity verified x2.  Mr. Randy Boyle presents today in the clinic for a 6 week follow up with Dr. Lurena Sally.  Patient follow-up for a diagnosis of: Basal Cell Carcinoma    Location-  Left Lateral Cheek  Patient is doing well post radiation. Skin is healing well.   They completed their radiation on: 01/21/2024  Does the patient complain of any of the following: Chest pains/ SOB: None Headaches/ Dizziness: None Post radiation skin issues: Skin is coming back to normal  Joint pain/ Swelling: None Motor function issues: None Range of motion limitations: None Fatigue post radiation: None Appetite good/fair/poor: Appetite is normal Additional comments if applicable:  None  BP 121/68 (BP Location: Left Arm, Patient Position: Sitting, Cuff Size: Large)   Pulse 70   Temp (!) 97.2 F (36.2 C)   Resp 20   Ht 5' 8.5" (1.74 m)   Wt 188 lb 3.2 oz (85.4 kg)   SpO2 99%   BMI 28.20 kg/m   Wt Readings from Last 3 Encounters:  02/29/24 188 lb 3.2 oz (85.4 kg)  12/07/23 192 lb (87.1 kg)  08/23/23 198 lb (89.8 kg)

## 2024-02-29 ENCOUNTER — Ambulatory Visit
Admission: RE | Admit: 2024-02-29 | Discharge: 2024-02-29 | Disposition: A | Discharge status: Home / Self Care | Source: Ambulatory Visit | Attending: Radiation Oncology | Admitting: Radiation Oncology

## 2024-02-29 VITALS — BP 121/68 | HR 70 | Temp 97.2°F | Resp 20 | Ht 68.5 in | Wt 188.2 lb

## 2024-02-29 DIAGNOSIS — C44319 Basal cell carcinoma of skin of other parts of face: Secondary | ICD-10-CM

## 2024-02-29 NOTE — Progress Notes (Signed)
 Radiation Oncology         [phone] ________________________________  Name: Randy Boyle MRN: [id number]  Date: 02/29/2024  DOB: Nov 05, 1950  Follow-Up Visit Note  CC: Randy Curt, MD  Randy Curt, MD  Diagnosis and Prior Radiotherapy:       ICD-10-CM   1. Basal cell carcinoma of skin of other parts of face  C44.319     2. Basal cell carcinoma (BCC) of left lateral cheek  C44.319       Cancer Staging  Basal cell carcinoma (BCC) of left lateral cheek Staging form: Cutaneous Carcinoma of the Head and Neck, AJCC 8th Edition - Clinical stage from 12/07/2023: Stage III (cT3, cN0, cM0) - Signed by Randy Dawes, MD on 12/07/2023 Stage prefix: Initial diagnosis Extraosseous extension: Absent  ==========DELIVERED PLANS==========  First Treatment Date: 2023-12-27 Last Treatment Date: 2024-01-21   Plan Name: [id number] Site: Cheek, Left Technique: Electron Mode: Electron Dose Per Fraction: 2.5 Gy Prescribed Dose (Delivered / Prescribed): 50 Gy / 50 Gy Prescribed Fxs (Delivered / Prescribed): 20 / 20  Stage III (cT3, N0, 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  He completed his treatment approximately 1 month ago.  Post radiation skin issues: Skin is coming back to normal, he is pleased with his progress to date. Swelling: None, patient denies any dysphagia or odynophagia Fatigue post radiation: None Appetite good/fair/poor: Appetite is normal.  Patient denies any residual taste changes or dry mouth.                ALLERGIES:  is allergic to oxycodone-acetaminophen and penicillins.  Meds: Current Outpatient Medications  Medication Sig Dispense Refill   fenofibrate  160 MG tablet Take 1 tablet (160 mg total) by mouth daily. 90 tablet 3   FLUoxetine   (PROZAC ) 20 MG capsule Take 1 capsule (20 mg total) by mouth daily. 90 capsule 3   losartan  (COZAAR ) 50 MG tablet Take 1 tablet (50 mg total) by mouth daily. 90 tablet 3   Multiple Vitamins-Minerals (MULTIVITAMIN GUMMIES ADULTS PO) Take by mouth daily.     rosuvastatin  (CRESTOR ) 10 MG tablet Take 1 tablet (10 mg total) by mouth daily. 90 tablet 3   No current facility-administered medications for this encounter.    Physical Findings: The patient is in no acute distress. Patient is alert and oriented. Wt Readings from Last 3 Encounters:  02/29/24 188 lb 3.2 oz (85.4 kg)  12/07/23 192 lb (87.1 kg)  08/23/23 198 lb (89.8 kg)    height is 5' 8.5" (1.74 m) and weight is 188 lb 3.2 oz (85.4 kg). His temperature is 97.2 F (36.2 C) (abnormal). His blood pressure is 121/68 and his pulse is 70. His respiration is 20 and oxygen saturation is 99%. .  General: Alert and oriented, in no acute distress HEENT: Head is normocephalic. Extraocular movements are intact.  Satisfactory skin healing within the treatment field (see below). Oropharynx is notable for mucositis changes on the tongue, but no concerning lesions. Neck: Neck is notable for no palpable adenopathy Skin: Skin in treatment fields shows satisfactory healing  Extremities: No cyanosis or edema. Lymphatics: see Neck Exam Psychiatric: Judgment and insight are intact. Affect is appropriate.      Lab Findings: Lab Results  Component Value Date   WBC 5.2 08/16/2023   HGB  14.4 08/16/2023   HCT 43.7 08/16/2023   MCV 86.6 08/16/2023   PLT 267.0 08/16/2023    Lab Results  Component Value Date   TSH 1.86 12/29/2010    Radiographic Findings: No results found.  Impression/Plan:  73 yo gentleman with stage III (cT3, N0, M0) basal cell carcinoma of the left lateral cheek; s/p definitive radiation completed on 01/21/2024  Patient has healed very well from the effects of his radiation treatment.  He is very pleased with his progress to  date.  Recommend follow-up with a regular dermatologist twice a year.  Patient states he is going to establish care with a new dermatologist at his preferred location.  He deferred a referral to dermatology at this time.  Radiation follow-up as needed.  We appreciate the opportunity to take part in this patient's care.  He was encouraged to call our office with any questions or concerns.   On date of service, in total, we spent 25 minutes on this encounter. Patient was seen in person. _____________________________________    Julio Ohm, PA-C   Randy Dawes, MD    [facility]  Radiation Oncology Direct Dial: [phone]  Fax: [phone] [email or web address]

## 2024-05-19 ENCOUNTER — Ambulatory Visit: Payer: PPO

## 2024-05-19 VITALS — Ht 68.5 in | Wt 188.0 lb

## 2024-05-19 DIAGNOSIS — Z0000 Encounter for general adult medical examination without abnormal findings: Secondary | ICD-10-CM

## 2024-05-19 NOTE — Progress Notes (Signed)
 Please attest and cosign this visit due to patients primary care provider not being in the office at the time the visit was completed.     Subjective:   Randy Boyle is a 73 y.o. who presents for a Medicare Wellness preventive visit.  As a reminder, Annual Wellness Visits don't include a physical exam, and some assessments may be limited, especially if this visit is performed virtually. We may recommend an in-person follow-up visit with your provider if needed.  Visit Complete: Virtual I connected with  Randy Boyle on 05/19/24 by a audio enabled telemedicine application and verified that I am speaking with the correct person using two identifiers.  Patient Location: Home  Provider Location: Office/Clinic  I discussed the limitations of evaluation and management by telemedicine. The patient expressed understanding and agreed to proceed.  Vital Signs: Because this visit was a virtual/telehealth visit, some criteria may be missing or patient reported. Any vitals not documented were not able to be obtained and vitals that have been documented are patient reported.  VideoDeclined- This patient declined Librarian, academic. Therefore the visit was completed with audio only.  Persons Participating in Visit: Patient.  AWV Questionnaire: No: Patient Medicare AWV questionnaire was not completed prior to this visit.  Cardiac Risk Factors include: advanced age (>85men, >68 women)     Objective:    Today's Vitals   05/19/24 1125  Weight: 188 lb (85.3 kg)  Height: 5' 8.5 (1.74 m)   Body mass index is 28.17 kg/m.     05/19/2024   11:40 AM 12/07/2023    9:44 AM 05/10/2023   11:17 AM 05/08/2022   12:18 PM 02/14/2020    2:41 PM 11/17/2018    8:17 AM 11/10/2017    2:17 PM  Advanced Directives  Does Patient Have a Medical Advance Directive? Yes Yes Yes Yes Yes Yes  Yes   Type of Estate agent of [place];Living will Healthcare Power of [employer] of [place];Living will Healthcare Power of [place];Living will Healthcare Power of [place];Living will Healthcare Power of [employer] of [place];Living will  Copy of Healthcare Power of Attorney in Chart? No - copy requested No - copy requested No - copy requested No - copy requested No - copy requested --  No - copy requested      Data saved with a previous flowsheet row definition    Current Medications (verified) Outpatient Encounter Medications as of 05/19/2024  Medication Sig   fenofibrate  160 MG tablet Take 1 tablet (160 mg total) by mouth daily.   FLUoxetine  (PROZAC ) 20 MG capsule Take 1 capsule (20 mg total) by mouth daily.   losartan  (COZAAR ) 50 MG tablet Take 1 tablet (50 mg total) by mouth daily.   Multiple Vitamins-Minerals (MULTIVITAMIN GUMMIES ADULTS PO) Take by mouth daily.   rosuvastatin  (CRESTOR ) 10 MG tablet Take 1 tablet (10 mg total) by mouth daily.   No facility-administered encounter medications on file as of 05/19/2024.    Allergies (verified) Oxycodone-acetaminophen and Penicillins   History: Past Medical History:  Diagnosis Date   Basal cell carcinoma 03/04/2020   nodular pattern at left forehead above mid brow  ED&C done   BCC (basal cell carcinoma of skin) 10/05/2023   left inferior auricular, Mohs 10/21/23   BCC (basal cell carcinoma of skin) 10/05/2023   left nasolabial fold, Mohs 10/21/23   Diverticulitis    HLD (hyperlipidemia)    HTN (hypertension)    Hx of  pancreatitis    Personal history of colon cancer, stage II    Adenocarcinoma   Past Surgical History:  Procedure Laterality Date   COLONOSCOPY     HEMICOLECTOMY  07/2006   Family History  Problem Relation Age of Onset   Colon cancer Neg Hx    Esophageal cancer Neg Hx    Rectal cancer Neg Hx    Stomach cancer Neg Hx    Social History   Socioeconomic History   Marital status: Married    Spouse name: Not on file   Number of children: 2   Years of  education: Not on file   Highest education level: Not on file  Occupational History   Occupation: Retired  Tobacco Use   Smoking status: Never   Smokeless tobacco: Never  Vaping Use   Vaping status: Never Used  Substance and Sexual Activity   Alcohol use: Yes    Comment: beer occassionally   Drug use: No   Sexual activity: Not on file  Other Topics Concern   Not on file  Social History Narrative   Jehovah's Witness   Married   Social Drivers of Health   Financial Resource Strain: Low Risk  (05/19/2024)   Overall Financial Resource Strain (CARDIA)    Difficulty of Paying Living Expenses: Not hard at all  Food Insecurity: No Food Insecurity (05/19/2024)   Hunger Vital Sign    Worried About Running Out of Food in the Last Year: Never true    Ran Out of Food in the Last Year: Never true  Transportation Needs: No Transportation Needs (05/19/2024)   PRAPARE - Administrator, Civil Service (Medical): No    Lack of Transportation (Non-Medical): No  Physical Activity: Insufficiently Active (05/19/2024)   Exercise Vital Sign    Days of Exercise per Week: 7 days    Minutes of Exercise per Session: 10 min  Stress: No Stress Concern Present (05/19/2024)   [employer] of Occupational Health - Occupational Stress Questionnaire    Feeling of Stress: Not at all  Social Connections: Socially Integrated (05/19/2024)   Social Connection and Isolation Panel    Frequency of Communication with Friends and Family: More than three times a week    Frequency of Social Gatherings with Friends and Family: More than three times a week    Attends Religious Services: More than 4 times per year    Active Member of [employer] or Organizations: Yes    Attends Engineer, structural: More than 4 times per year    Marital Status: Married    Tobacco Counseling Counseling given: Not Answered   Clinical Intake:  Pre-visit preparation completed: Yes  Pain : No/denies pain    BMI -  recorded: 28.17 Nutritional Status: BMI 25 -29 Overweight Nutritional Risks: None Diabetes: No  Lab Results  Component Value Date   HGBA1C 6.3 08/16/2023   HGBA1C 6.2 05/11/2022   HGBA1C 6.2 04/30/2021     How often do you need to have someone help you when you read instructions, pamphlets, or other written materials from your doctor or pharmacy?: 1 - Never  Interpreter Needed?: No  Comments: lives with wife Information entered by :: B.Trevis Eden,LPN   Activities of Daily Living     05/19/2024   11:40 AM  In your present state of health, do you have any difficulty performing the following activities:  Hearing? 0  Vision? 0  Difficulty concentrating or making decisions? 0  Walking or climbing stairs? 0  Dressing or bathing? 0  Doing errands, shopping? 0  Preparing Food and eating ? N  Using the Toilet? N  In the past six months, have you accidently leaked urine? N  Do you have problems with loss of bowel control? N  Managing your Medications? N  Managing your Finances? N  Housekeeping or managing your Housekeeping? N    Patient Care Team: Watt Mirza, MD as PCP - General [place], Delon CROME, RN as Oncology Nurse Navigator Izell Domino, MD as Consulting Physician (Radiation Oncology) Meade Diego BIRCH, MD as Referring Physician (Dermatology)  I have updated your Care Teams any recent Medical Services you may have received from other providers in the past year.     Assessment:   This is a routine wellness examination for Randy Boyle.  Hearing/Vision screen Hearing Screening - Comments:: Patient denies any hearing difficulties.   Vision Screening - Comments:: Pt says their vision is good with glasses No optometrist-says does not need   Goals Addressed             This Visit's Progress    Patient Stated   On track    05/19/24 - I will continue to walk everyday for 30 minutes.  Continue fellowship and going to future religious meetings     Patient Stated   On  track    05/19/24-Stay active and lose weight.       Depression Screen     05/19/2024   11:34 AM 05/10/2023   11:12 AM 05/08/2022   12:15 PM 05/07/2021    8:38 AM 02/14/2020    2:43 PM 11/17/2018    8:17 AM 11/10/2017    2:18 PM  PHQ 2/9 Scores  PHQ - 2 Score 0 0 0 0 0 0 0  PHQ- 9 Score     0 0 0    Fall Risk     05/19/2024   11:28 AM 05/10/2023   11:15 AM 05/08/2022   12:14 PM 05/07/2021    8:38 AM 02/14/2020    2:42 PM  Fall Risk   Falls in the past year? 0 1 0 0 0  Number falls in past yr: 0 1 0  0  Comment  tripped over object both falls     Injury with Fall? 0 0 0  0  Risk for fall due to : No Fall Risks No Fall Risks No Fall Risks  Medication side effect  Follow up Falls prevention discussed;Education provided Falls prevention discussed;Falls evaluation completed Falls prevention discussed   Falls evaluation completed;Falls prevention discussed      Data saved with a previous flowsheet row definition    MEDICARE RISK AT HOME:  Medicare Risk at Home Any stairs in or around the home?: Yes If so, are there any without handrails?: Yes Home free of loose throw rugs in walkways, pet beds, electrical cords, etc?: Yes Adequate lighting in your home to reduce risk of falls?: Yes Life alert?: No Use of a cane, walker or w/c?: No Grab bars in the bathroom?: Yes Shower chair or bench in shower?: Yes Elevated toilet seat or a handicapped toilet?: Yes  TIMED UP AND GO:  Was the test performed?  No  Cognitive Function: 6CIT completed    02/14/2020    2:44 PM 11/17/2018    8:17 AM 11/10/2017    2:15 PM  MMSE - Mini Mental State Exam  Orientation to time 5 5 5    Orientation to Place 5 5 5    Registration 3  3 3   Attention/ Calculation 5 0 0   Recall 3 3 3    Language- name 2 objects  0 0   Language- repeat 1 1 1   Language- follow 3 step command  3 3   Language- read & follow direction  0 0   Write a sentence  0 0   Copy design  0 0   Total score  20 20      Data saved  with a previous flowsheet row definition        05/19/2024   11:45 AM 05/10/2023   11:19 AM 05/08/2022   12:19 PM  6CIT Screen  What Year? 0 points 0 points 0 points  What month? 0 points 0 points 0 points  What time? 0 points 0 points 0 points  Count back from 20 0 points 0 points 0 points  Months in reverse 0 points 0 points 0 points  Repeat phrase 0 points 0 points 2 points  Total Score 0 points 0 points 2 points    Immunizations Immunization History  Administered Date(s) Administered   PFIZER(Purple Top)SARS-COV-2 Vaccination 03/30/2020, 04/20/2020, 11/04/2020, 03/21/2022   Td 03/09/2008    Screening Tests Health Maintenance  Topic Date Due   Zoster Vaccines- Shingrix (1 of 2) Never done   Colonoscopy  11/07/2020   COVID-19 Vaccine (5 - 2024-25 season) 05/23/2023   INFLUENZA VACCINE  04/21/2024   DTaP/Tdap/Td (2 - Tdap) 08/22/2024 (Originally 03/09/2018)   Pneumococcal Vaccine: 50+ Years (1 of 2 - PCV) 08/22/2024 (Originally 04/19/1970)   Medicare Annual Wellness (AWV)  05/19/2025   Hepatitis C Screening  Completed   HPV VACCINES  Aged Out   Meningococcal B Vaccine  Aged Out    Health Maintenance  Health Maintenance Due  Topic Date Due   Zoster Vaccines- Shingrix (1 of 2) Never done   Colonoscopy  11/07/2020   COVID-19 Vaccine (5 - 2024-25 season) 05/23/2023   INFLUENZA VACCINE  04/21/2024   Health Maintenance Items Addressed: None due at this time. Pt will receive vaccines at their pharmcy when decided to obtain   Additional Screening:  Vision Screening: Recommended annual ophthalmology exams for early detection of glaucoma and other disorders of the eye. Would you like a referral to an eye doctor? No    Dental Screening: Recommended annual dental exams for proper oral hygiene  Community Resource Referral / Chronic Care Management: CRR required this visit?  No   CCM required this visit?  No   Plan:    I have personally reviewed and noted the  following in the patient's chart:   Medical and social history Use of alcohol, tobacco or illicit drugs  Current medications and supplements including opioid prescriptions. Patient is not currently taking opioid prescriptions. Functional ability and status Nutritional status Physical activity Advanced directives List of other physicians Hospitalizations, surgeries, and ER visits in previous 12 months Vitals Screenings to include cognitive, depression, and falls Referrals and appointments  In addition, I have reviewed and discussed with patient certain preventive protocols, quality metrics, and best practice recommendations. A written personalized care plan for preventive services as well as general preventive health recommendations were provided to patient.   Erminio LITTIE Saris, LPN   [id number]   After Visit Summary: (Declined) Due to this being a telephonic visit, with patients personalized plan was offered to patient but patient Declined AVS at this time   Notes: Nothing significant to report at this time.

## 2024-05-19 NOTE — Patient Instructions (Signed)
 Mr. Randy Boyle , Thank you for taking time out of your busy schedule to complete your Annual Wellness Visit with me. I enjoyed our conversation and look forward to speaking with you again next year. I, as well as your care team,  appreciate your ongoing commitment to your health goals. Please review the following plan we discussed and let me know if I can assist you in the future. Your Game plan/ To Do List    Referrals: If you haven't heard from the office you've been referred to, please reach out to them at the phone provided.   Follow up Visits: We will see or speak with you next year for your Next Medicare AWV with our clinical staff Have you seen your provider in the last 6 months (3 months if uncontrolled diabetes)? No  Clinician Recommendations:  Aim for 30 minutes of exercise or brisk walking, 6-8 glasses of water, and 5 servings of fruits and vegetables each day.       This is a list of the screenings recommended for you:  Health Maintenance  Topic Date Due   Zoster (Shingles) Vaccine (1 of 2) Never done   Colon Cancer Screening  11/07/2020   COVID-19 Vaccine (5 - 2024-25 season) 05/23/2023   Flu Shot  04/21/2024   DTaP/Tdap/Td vaccine (2 - Tdap) 08/22/2024*   Pneumococcal Vaccine for age over 15 (1 of 2 - PCV) 08/22/2024*   Medicare Annual Wellness Visit  05/19/2025   Hepatitis C Screening  Completed   HPV Vaccine  Aged Out   Meningitis B Vaccine  Aged Out  *Topic was postponed. The date shown is not the original due date.    Advanced directives: (Copy Requested) Please bring a copy of your health care power of attorney and living will to the office to be added to your chart at your convenience. You can mail to [facility] [address] [place], [place] [address] or email to [email or web address] Advance Care Planning is important because it:  [x]  Makes sure you receive the medical care that is consistent with your values, goals, and preferences  [x]  It  provides guidance to your family and loved ones and reduces their decisional burden about whether or not they are making the right decisions based on your wishes.  Follow the link provided in your after visit summary or read over the paperwork we have mailed to you to help you started getting your Advance Directives in place. If you need assistance in completing these, please reach out to us  so that we can help you!

## 2024-05-30 DIAGNOSIS — D485 Neoplasm of uncertain behavior of skin: Secondary | ICD-10-CM | POA: Diagnosis not present

## 2024-05-30 DIAGNOSIS — D1801 Hemangioma of skin and subcutaneous tissue: Secondary | ICD-10-CM | POA: Diagnosis not present

## 2024-05-30 DIAGNOSIS — L821 Other seborrheic keratosis: Secondary | ICD-10-CM | POA: Diagnosis not present

## 2024-05-30 DIAGNOSIS — Z85828 Personal history of other malignant neoplasm of skin: Secondary | ICD-10-CM | POA: Diagnosis not present

## 2024-05-30 DIAGNOSIS — L538 Other specified erythematous conditions: Secondary | ICD-10-CM | POA: Diagnosis not present

## 2024-05-30 DIAGNOSIS — L84 Corns and callosities: Secondary | ICD-10-CM | POA: Diagnosis not present

## 2024-05-30 DIAGNOSIS — Z08 Encounter for follow-up examination after completed treatment for malignant neoplasm: Secondary | ICD-10-CM | POA: Diagnosis not present

## 2024-05-30 DIAGNOSIS — D044 Carcinoma in situ of skin of scalp and neck: Secondary | ICD-10-CM | POA: Diagnosis not present

## 2024-05-30 DIAGNOSIS — D0439 Carcinoma in situ of skin of other parts of face: Secondary | ICD-10-CM | POA: Diagnosis not present

## 2024-05-30 DIAGNOSIS — R208 Other disturbances of skin sensation: Secondary | ICD-10-CM | POA: Diagnosis not present

## 2024-05-30 DIAGNOSIS — Z1283 Encounter for screening for malignant neoplasm of skin: Secondary | ICD-10-CM | POA: Diagnosis not present

## 2024-06-26 DIAGNOSIS — D0439 Carcinoma in situ of skin of other parts of face: Secondary | ICD-10-CM | POA: Diagnosis not present

## 2024-06-26 DIAGNOSIS — D044 Carcinoma in situ of skin of scalp and neck: Secondary | ICD-10-CM | POA: Diagnosis not present

## 2024-06-27 DIAGNOSIS — D044 Carcinoma in situ of skin of scalp and neck: Secondary | ICD-10-CM | POA: Diagnosis not present

## 2024-06-27 DIAGNOSIS — D0439 Carcinoma in situ of skin of other parts of face: Secondary | ICD-10-CM | POA: Diagnosis not present

## 2024-06-28 DIAGNOSIS — D0439 Carcinoma in situ of skin of other parts of face: Secondary | ICD-10-CM | POA: Diagnosis not present

## 2024-06-28 DIAGNOSIS — D044 Carcinoma in situ of skin of scalp and neck: Secondary | ICD-10-CM | POA: Diagnosis not present

## 2024-06-29 DIAGNOSIS — D0439 Carcinoma in situ of skin of other parts of face: Secondary | ICD-10-CM | POA: Diagnosis not present

## 2024-06-29 DIAGNOSIS — D044 Carcinoma in situ of skin of scalp and neck: Secondary | ICD-10-CM | POA: Diagnosis not present

## 2024-07-03 DIAGNOSIS — D044 Carcinoma in situ of skin of scalp and neck: Secondary | ICD-10-CM | POA: Diagnosis not present

## 2024-07-03 DIAGNOSIS — D0439 Carcinoma in situ of skin of other parts of face: Secondary | ICD-10-CM | POA: Diagnosis not present

## 2024-07-04 DIAGNOSIS — D0439 Carcinoma in situ of skin of other parts of face: Secondary | ICD-10-CM | POA: Diagnosis not present

## 2024-07-04 DIAGNOSIS — D044 Carcinoma in situ of skin of scalp and neck: Secondary | ICD-10-CM | POA: Diagnosis not present

## 2024-07-05 DIAGNOSIS — D044 Carcinoma in situ of skin of scalp and neck: Secondary | ICD-10-CM | POA: Diagnosis not present

## 2024-07-05 DIAGNOSIS — D0439 Carcinoma in situ of skin of other parts of face: Secondary | ICD-10-CM | POA: Diagnosis not present

## 2024-07-10 DIAGNOSIS — D044 Carcinoma in situ of skin of scalp and neck: Secondary | ICD-10-CM | POA: Diagnosis not present

## 2024-07-10 DIAGNOSIS — D0439 Carcinoma in situ of skin of other parts of face: Secondary | ICD-10-CM | POA: Diagnosis not present

## 2024-07-11 DIAGNOSIS — D0439 Carcinoma in situ of skin of other parts of face: Secondary | ICD-10-CM | POA: Diagnosis not present

## 2024-07-11 DIAGNOSIS — D044 Carcinoma in situ of skin of scalp and neck: Secondary | ICD-10-CM | POA: Diagnosis not present

## 2024-07-12 DIAGNOSIS — D044 Carcinoma in situ of skin of scalp and neck: Secondary | ICD-10-CM | POA: Diagnosis not present

## 2024-07-12 DIAGNOSIS — D0439 Carcinoma in situ of skin of other parts of face: Secondary | ICD-10-CM | POA: Diagnosis not present

## 2024-07-13 DIAGNOSIS — D0439 Carcinoma in situ of skin of other parts of face: Secondary | ICD-10-CM | POA: Diagnosis not present

## 2024-07-13 DIAGNOSIS — D044 Carcinoma in situ of skin of scalp and neck: Secondary | ICD-10-CM | POA: Diagnosis not present

## 2024-07-17 DIAGNOSIS — D044 Carcinoma in situ of skin of scalp and neck: Secondary | ICD-10-CM | POA: Diagnosis not present

## 2024-07-17 DIAGNOSIS — D0439 Carcinoma in situ of skin of other parts of face: Secondary | ICD-10-CM | POA: Diagnosis not present

## 2024-07-18 DIAGNOSIS — D044 Carcinoma in situ of skin of scalp and neck: Secondary | ICD-10-CM | POA: Diagnosis not present

## 2024-07-18 DIAGNOSIS — D0439 Carcinoma in situ of skin of other parts of face: Secondary | ICD-10-CM | POA: Diagnosis not present

## 2024-07-19 DIAGNOSIS — D044 Carcinoma in situ of skin of scalp and neck: Secondary | ICD-10-CM | POA: Diagnosis not present

## 2024-07-19 DIAGNOSIS — D0439 Carcinoma in situ of skin of other parts of face: Secondary | ICD-10-CM | POA: Diagnosis not present

## 2024-07-20 DIAGNOSIS — D044 Carcinoma in situ of skin of scalp and neck: Secondary | ICD-10-CM | POA: Diagnosis not present

## 2024-07-20 DIAGNOSIS — D0439 Carcinoma in situ of skin of other parts of face: Secondary | ICD-10-CM | POA: Diagnosis not present

## 2024-07-24 DIAGNOSIS — D044 Carcinoma in situ of skin of scalp and neck: Secondary | ICD-10-CM | POA: Diagnosis not present

## 2024-07-24 DIAGNOSIS — D0439 Carcinoma in situ of skin of other parts of face: Secondary | ICD-10-CM | POA: Diagnosis not present

## 2024-07-25 DIAGNOSIS — D044 Carcinoma in situ of skin of scalp and neck: Secondary | ICD-10-CM | POA: Diagnosis not present

## 2024-07-25 DIAGNOSIS — D0439 Carcinoma in situ of skin of other parts of face: Secondary | ICD-10-CM | POA: Diagnosis not present

## 2024-07-26 DIAGNOSIS — D0439 Carcinoma in situ of skin of other parts of face: Secondary | ICD-10-CM | POA: Diagnosis not present

## 2024-07-26 DIAGNOSIS — D044 Carcinoma in situ of skin of scalp and neck: Secondary | ICD-10-CM | POA: Diagnosis not present

## 2024-07-27 DIAGNOSIS — D044 Carcinoma in situ of skin of scalp and neck: Secondary | ICD-10-CM | POA: Diagnosis not present

## 2024-07-27 DIAGNOSIS — D0439 Carcinoma in situ of skin of other parts of face: Secondary | ICD-10-CM | POA: Diagnosis not present

## 2024-07-31 DIAGNOSIS — D044 Carcinoma in situ of skin of scalp and neck: Secondary | ICD-10-CM | POA: Diagnosis not present

## 2024-07-31 DIAGNOSIS — D0439 Carcinoma in situ of skin of other parts of face: Secondary | ICD-10-CM | POA: Diagnosis not present

## 2024-08-01 DIAGNOSIS — D0439 Carcinoma in situ of skin of other parts of face: Secondary | ICD-10-CM | POA: Diagnosis not present

## 2024-08-01 DIAGNOSIS — D044 Carcinoma in situ of skin of scalp and neck: Secondary | ICD-10-CM | POA: Diagnosis not present

## 2024-08-21 ENCOUNTER — Other Ambulatory Visit: Payer: Self-pay | Admitting: Family Medicine

## 2024-08-22 ENCOUNTER — Other Ambulatory Visit: Payer: Self-pay | Admitting: Family Medicine

## 2025-05-21 ENCOUNTER — Ambulatory Visit
